# Patient Record
Sex: Female | Born: 1970 | Race: White | Hispanic: No | Marital: Single | Smoking: Never smoker
Health system: Southern US, Community
[De-identification: ages and names within clinical notes are randomized; demographics above are authoritative.]

## PROBLEM LIST (undated history)

## (undated) DIAGNOSIS — F329 Major depressive disorder, single episode, unspecified: Secondary | ICD-10-CM

## (undated) DIAGNOSIS — J302 Other seasonal allergic rhinitis: Secondary | ICD-10-CM

## (undated) DIAGNOSIS — F32A Depression, unspecified: Secondary | ICD-10-CM

## (undated) HISTORY — PX: CHOLECYSTECTOMY: SHX55

---

## 2005-07-17 ENCOUNTER — Observation Stay: Payer: Self-pay | Admitting: Obstetrics and Gynecology

## 2005-08-10 ENCOUNTER — Ambulatory Visit: Payer: Self-pay | Admitting: Obstetrics and Gynecology

## 2005-08-25 ENCOUNTER — Observation Stay: Payer: Self-pay | Admitting: Certified Nurse Midwife

## 2005-10-03 ENCOUNTER — Inpatient Hospital Stay: Payer: Self-pay | Admitting: Obstetrics and Gynecology

## 2007-02-24 ENCOUNTER — Ambulatory Visit: Payer: Self-pay | Admitting: Internal Medicine

## 2008-03-30 ENCOUNTER — Other Ambulatory Visit: Payer: Self-pay

## 2008-03-30 ENCOUNTER — Emergency Department: Payer: Self-pay | Admitting: Emergency Medicine

## 2008-04-02 ENCOUNTER — Ambulatory Visit: Payer: Self-pay

## 2008-07-14 ENCOUNTER — Ambulatory Visit: Payer: Self-pay | Admitting: Family Medicine

## 2009-03-25 ENCOUNTER — Ambulatory Visit: Payer: Self-pay | Admitting: Internal Medicine

## 2009-04-04 ENCOUNTER — Ambulatory Visit: Payer: Self-pay | Admitting: Surgery

## 2009-05-19 ENCOUNTER — Ambulatory Visit: Payer: Self-pay

## 2012-03-17 ENCOUNTER — Ambulatory Visit: Payer: Self-pay | Admitting: Internal Medicine

## 2012-06-01 ENCOUNTER — Ambulatory Visit: Payer: Self-pay | Admitting: Medical

## 2012-09-12 ENCOUNTER — Ambulatory Visit: Payer: Self-pay

## 2012-09-14 ENCOUNTER — Ambulatory Visit: Payer: Self-pay

## 2012-09-23 ENCOUNTER — Emergency Department: Payer: Self-pay | Admitting: Emergency Medicine

## 2012-09-23 LAB — COMPREHENSIVE METABOLIC PANEL
Albumin: 4.4 g/dL (ref 3.4–5.0)
Alkaline Phosphatase: 90 U/L (ref 50–136)
Anion Gap: 7 (ref 7–16)
Calcium, Total: 8.7 mg/dL (ref 8.5–10.1)
Chloride: 109 mmol/L — ABNORMAL HIGH (ref 98–107)
Co2: 24 mmol/L (ref 21–32)
EGFR (African American): >60
Glucose: 93 mg/dL (ref 65–99)
Osmolality: 280 (ref 275–301)
Potassium: 3.8 mmol/L (ref 3.5–5.1)
SGOT(AST): 20 U/L (ref 15–37)
SGPT (ALT): 24 U/L (ref 12–78)
Total Protein: 8.4 g/dL — ABNORMAL HIGH (ref 6.4–8.2)

## 2012-09-23 LAB — URINALYSIS, COMPLETE
Bilirubin,UR: NEGATIVE
Ketone: NEGATIVE
Nitrite: NEGATIVE
Squamous Epithelial: <1

## 2012-09-23 LAB — CBC
HCT: 41.6 % (ref 35.0–47.0)
HGB: 14.2 g/dL (ref 12.0–16.0)
MCH: 29.2 pg (ref 26.0–34.0)
MCHC: 34.2 g/dL (ref 32.0–36.0)
MCV: 85 fL (ref 80–100)
Platelet: 270 10*3/uL (ref 150–440)
RBC: 4.87 10*6/uL (ref 3.80–5.20)
WBC: 10.6 10*3/uL (ref 3.6–11.0)

## 2012-09-23 LAB — LIPASE, BLOOD: Lipase: 240 U/L (ref 73–393)

## 2013-08-04 ENCOUNTER — Ambulatory Visit: Payer: Self-pay | Admitting: Physician Assistant

## 2013-09-13 ENCOUNTER — Ambulatory Visit: Payer: Self-pay

## 2014-02-25 ENCOUNTER — Ambulatory Visit: Payer: Self-pay | Admitting: Physician Assistant

## 2014-02-25 LAB — RAPID STREP-A WITH REFLX: Micro Text Report: NEGATIVE

## 2014-02-28 LAB — BETA STREP CULTURE(ARMC)

## 2014-09-16 ENCOUNTER — Ambulatory Visit: Payer: Self-pay

## 2015-07-22 ENCOUNTER — Other Ambulatory Visit: Payer: Self-pay | Admitting: Obstetrics and Gynecology

## 2015-07-22 DIAGNOSIS — Z1231 Encounter for screening mammogram for malignant neoplasm of breast: Secondary | ICD-10-CM

## 2015-09-18 ENCOUNTER — Ambulatory Visit
Admission: RE | Admit: 2015-09-18 | Discharge: 2015-09-18 | Disposition: A | Discharge status: Home / Self Care | Payer: BLUE CROSS/BLUE SHIELD | Source: Ambulatory Visit | Attending: Obstetrics and Gynecology | Admitting: Obstetrics and Gynecology

## 2015-09-18 DIAGNOSIS — Z1231 Encounter for screening mammogram for malignant neoplasm of breast: Secondary | ICD-10-CM | POA: Diagnosis present

## 2016-03-02 ENCOUNTER — Ambulatory Visit
Admission: EM | Admit: 2016-03-02 | Discharge: 2016-03-02 | Disposition: A | Discharge status: Home / Self Care | Payer: BLUE CROSS/BLUE SHIELD | Attending: Family Medicine | Admitting: Family Medicine

## 2016-03-02 ENCOUNTER — Encounter: Payer: Self-pay | Admitting: Emergency Medicine

## 2016-03-02 DIAGNOSIS — S161XXA Strain of muscle, fascia and tendon at neck level, initial encounter: Secondary | ICD-10-CM

## 2016-03-02 MED ORDER — NAPROXEN 500 MG PO TABS: 500.0000 mg | ORAL_TABLET | Freq: Two times a day (BID) | ORAL | Status: DC

## 2016-03-02 MED ORDER — TIZANIDINE HCL 4 MG PO TABS: 4.0000 mg | ORAL_TABLET | Freq: Four times a day (QID) | ORAL | Status: DC | PRN

## 2016-03-02 NOTE — ED Notes (Signed)
Patient c/o pain in her right ear for 7 days. Patient denies fevers.

## 2016-03-02 NOTE — ED Provider Notes (Signed)
CSN: [id number]     Arrival date & time 03/02/16  1045 History   First MD Initiated Contact with Patient 03/02/16 1056     Chief Complaint  Patient presents with  . Otalgia   (Consider location/radiation/quality/duration/timing/severity/associated sxs/prior Treatment) HPI  So 45 year old female who presents with right-sided neck pain which she initially thought was in her ear but actually radiates into her septal area and down her neck. It does become more uncomfortable with certain motions. She does not remember any specific injury to her neck but does relate that she has been sleeping on 2 pillows which began about a week ago. She denies any upper extremity radicular symptoms.      History reviewed. No pertinent past medical history. Past Surgical History  Procedure Laterality Date  . Cholecystectomy     Family History  Problem Relation Age of Onset  . Breast cancer Neg Hx    Social History  Substance Use Topics  . Smoking status: Never Smoker   . Smokeless tobacco: None  . Alcohol Use: Yes   OB History    No data available     Review of Systems  Constitutional: Positive for activity change. Negative for fever, chills and fatigue.  Musculoskeletal: Positive for neck pain.  All other systems reviewed and are negative.   Allergies  Review of patient's allergies indicates no known allergies.  Home Medications   Prior to Admission medications   Medication Sig Start Date End Date Taking? Authorizing Provider  cetirizine (ZYRTEC) 10 MG tablet Take 10 mg by mouth daily.   Yes Historical Provider, MD  clonazePAM (KLONOPIN) 0.5 MG tablet Take 0.5 mg by mouth daily.   Yes Historical Provider, MD  desvenlafaxine (PRISTIQ) 50 MG 24 hr tablet Take 50 mg by mouth daily.   Yes Historical Provider, MD  naproxen (NAPROSYN) 500 MG tablet Take 1 tablet (500 mg total) by mouth 2 (two) times daily with a meal. 03/02/16   Lutricia FeilWilliam P Jamyah Folk, PA-C  tiZANidine (ZANAFLEX) 4 MG tablet Take 1  tablet (4 mg total) by mouth every 6 (six) hours as needed for muscle spasms. 03/02/16   Lutricia FeilWilliam P Leonidus Rowand, PA-C   Meds Ordered and Administered this Visit  Medications - No data to display  BP 118/75 mmHg  Pulse 87  Temp(Src) 98.1 F (36.7 C) (Oral)  Resp 16  Ht 5\' 8"  (1.727 m)  Wt 180 lb (81.647 kg)  BMI 27.38 kg/m2  SpO2 99%  LMP 02/11/2016 (Approximate) No data found.   Physical Exam  Constitutional: She is oriented to person, place, and time. She appears well-developed and well-nourished. No distress.  HENT:  Head: Normocephalic and atraumatic.  Right Ear: External ear normal.  Left Ear: External ear normal.  Nose: Nose normal.  Mouth/Throat: Oropharynx is clear and moist. No oropharyngeal exudate.  Eyes: Conjunctivae are normal. Pupils are equal, round, and reactive to light.  Neck: Neck supple.  Examination of the cervical spine shows increased range of motion to extension and rotation to the left and increased pain with reproduction of her symptoms to lateral flexion and extension. Tenderness to palpation in the occipital area swells along the paraspinous muscles on the right and sending into the trapezius. Upper extremity and strength is intact to testing and sensation is intact to light touch. DTRs are 2+ over 4 and symmetrical.  Musculoskeletal: Normal range of motion. She exhibits tenderness. She exhibits no edema.  Lymphadenopathy:    She has no cervical adenopathy.  Neurological: She is  alert and oriented to person, place, and time. She has normal reflexes. She exhibits normal muscle tone. Coordination normal.  Skin: Skin is warm and dry. She is not diaphoretic.  Psychiatric: She has a normal mood and affect. Her behavior is normal. Judgment and thought content normal.  Nursing note and vitals reviewed.   ED Course  Procedures (including critical care time)  Labs Review Labs Reviewed - No data to display  Imaging Review No results found.   Visual Acuity  Review  Right Eye Distance:   Left Eye Distance:   Bilateral Distance:    Right Eye Near:   Left Eye Near:    Bilateral Near:         MDM   1. Cervical strain, acute, initial encounter    Discharge Medication List as of 03/02/2016 11:28 AM    START taking these medications   Details  naproxen (NAPROSYN) 500 MG tablet Take 1 tablet (500 mg total) by mouth 2 (two) times daily with a meal., Starting 03/02/2016, Until Discontinued, Normal    tiZANidine (ZANAFLEX) 4 MG tablet Take 1 tablet (4 mg total) by mouth every 6 (six) hours as needed for muscle spasms., Starting 03/02/2016, Until Discontinued, Normal      Plan: 1. Test/x-ray results and diagnosis reviewed with patient 2. rx as per orders; risks, benefits, potential side effects reviewed with patient 3. Recommend supportive treatment with Symptom avoidance and rest. I recommended the use of heat or ice and Biofreeze for comfort. I will prescribe anti-inflammatory medication and a muscle relaxers. I have cautioned her regarding the use of a muscle relaxer  with activities requiring concentration and judgment. She should not drive while taking the muscle relaxers. If she is not improving she may return to the clinic. I've also asked her to remove one of the pillows and use the pillows that she normally sleeps on. This may be causing her pain. 4. F/u prn if symptoms worsen or don't improve     Lutricia FeilWilliam P Johnjoseph Rolfe, PA-C 03/02/16 1137

## 2016-03-02 NOTE — Discharge Instructions (Signed)
Cervical Sprain  A cervical sprain is an injury in the neck in which the strong, fibrous tissues (ligaments) that connect your neck bones stretch or tear. Cervical sprains can range from mild to severe. Severe cervical sprains can cause the neck vertebrae to be unstable. This can lead to damage of the spinal cord and can result in serious nervous system problems. The amount of time it takes for a cervical sprain to get better depends on the cause and extent of the injury. Most cervical sprains heal in 1 to 3 weeks.  CAUSES   Severe cervical sprains may be caused by:    Contact sport injuries (such as from football, rugby, wrestling, hockey, auto racing, gymnastics, diving, martial arts, or boxing).    Motor vehicle collisions.    Whiplash injuries. This is an injury from a sudden forward and backward whipping movement of the head and neck.   Falls.   Mild cervical sprains may be caused by:    Being in an awkward position, such as while cradling a telephone between your ear and shoulder.    Sitting in a chair that does not offer proper support.    Working at a poorly designed computer station.    Looking up or down for long periods of time.   SYMPTOMS    Pain, soreness, stiffness, or a burning sensation in the front, back, or sides of the neck. This discomfort may develop immediately after the injury or slowly, 24 hours or more after the injury.    Pain or tenderness directly in the middle of the back of the neck.    Shoulder or upper back pain.    Limited ability to move the neck.    Headache.    Dizziness.    Weakness, numbness, or tingling in the hands or arms.    Muscle spasms.    Difficulty swallowing or chewing.    Tenderness and swelling of the neck.   DIAGNOSIS   Most of the time your health care provider can diagnose a cervical sprain by taking your history and doing a physical exam. Your health care provider will ask about previous neck injuries and any known neck  problems, such as arthritis in the neck. X-rays may be taken to find out if there are any other problems, such as with the bones of the neck. Other tests, such as a CT scan or MRI, may also be needed.   TREATMENT   Treatment depends on the severity of the cervical sprain. Mild sprains can be treated with rest, keeping the neck in place (immobilization), and pain medicines. Severe cervical sprains are immediately immobilized. Further treatment is done to help with pain, muscle spasms, and other symptoms and may include:   Medicines, such as pain relievers, numbing medicines, or muscle relaxants.    Physical therapy. This may involve stretching exercises, strengthening exercises, and posture training. Exercises and improved posture can help stabilize the neck, strengthen muscles, and help stop symptoms from returning.   HOME CARE INSTRUCTIONS    Put ice on the injured area.     Put ice in a plastic bag.     Place a towel between your skin and the bag.     Leave the ice on for 15-20 minutes, 3-4 times a day.    If your injury was severe, you may have been given a cervical collar to wear. A cervical collar is a two-piece collar designed to keep your neck from moving while it heals.      Do not remove the collar unless instructed by your health care provider.    If you have long hair, keep it outside of the collar.    Ask your health care provider before making any adjustments to your collar. Minor adjustments may be required over time to improve comfort and reduce pressure on your chin or on the back of your head.    Ifyou are allowed to remove the collar for cleaning or bathing, follow your health care provider's instructions on how to do so safely.    Keep your collar clean by wiping it with mild soap and water and drying it completely. If the collar you have been given includes removable pads, remove them every 1-2 days and hand wash them with soap and water. Allow them to air dry. They should be completely  dry before you wear them in the collar.    If you are allowed to remove the collar for cleaning and bathing, wash and dry the skin of your neck. Check your skin for irritation or sores. If you see any, tell your health care provider.    Do not drive while wearing the collar.    Only take over-the-counter or prescription medicines for pain, discomfort, or fever as directed by your health care provider.    Keep all follow-up appointments as directed by your health care provider.    Keep all physical therapy appointments as directed by your health care provider.    Make any needed adjustments to your workstation to promote good posture.    Avoid positions and activities that make your symptoms worse.    Warm up and stretch before being active to help prevent problems.   SEEK MEDICAL CARE IF:    Your pain is not controlled with medicine.    You are unable to decrease your pain medicine over time as planned.    Your activity level is not improving as expected.   SEEK IMMEDIATE MEDICAL CARE IF:    You develop any bleeding.   You develop stomach upset.   You have signs of an allergic reaction to your medicine.    Your symptoms get worse.    You develop new, unexplained symptoms.    You have numbness, tingling, weakness, or paralysis in any part of your body.   MAKE SURE YOU:    Understand these instructions.   Will watch your condition.   Will get help right away if you are not doing well or get worse.     This information is not intended to replace advice given to you by your health care provider. Make sure you discuss any questions you have with your health care provider.     Document Released: 06/13/2007 Document Revised: 08/21/2013 Document Reviewed: 02/21/2013  Elsevier Interactive Patient Education 2016 [employer]

## 2016-07-27 ENCOUNTER — Other Ambulatory Visit: Payer: Self-pay | Admitting: Obstetrics and Gynecology

## 2016-07-27 DIAGNOSIS — Z1231 Encounter for screening mammogram for malignant neoplasm of breast: Secondary | ICD-10-CM

## 2016-09-14 ENCOUNTER — Ambulatory Visit
Admission: RE | Admit: 2016-09-14 | Discharge: 2016-09-14 | Disposition: A | Discharge status: Home / Self Care | Payer: BLUE CROSS/BLUE SHIELD | Source: Ambulatory Visit | Attending: Obstetrics and Gynecology | Admitting: Obstetrics and Gynecology

## 2016-09-14 DIAGNOSIS — Z1231 Encounter for screening mammogram for malignant neoplasm of breast: Secondary | ICD-10-CM | POA: Insufficient documentation

## 2018-05-28 ENCOUNTER — Ambulatory Visit
Admission: EM | Admit: 2018-05-28 | Discharge: 2018-05-28 | Disposition: A | Discharge status: Home / Self Care | Payer: 59 | Attending: Emergency Medicine | Admitting: Emergency Medicine

## 2018-05-28 ENCOUNTER — Encounter: Payer: Self-pay | Admitting: Gynecology

## 2018-05-28 ENCOUNTER — Other Ambulatory Visit: Payer: Self-pay

## 2018-05-28 DIAGNOSIS — T783XXA Angioneurotic edema, initial encounter: Secondary | ICD-10-CM | POA: Diagnosis not present

## 2018-05-28 MED ORDER — PREDNISONE 20 MG PO TABS: ORAL_TABLET | ORAL | 0 refills | Status: DC

## 2018-05-28 MED ORDER — TRIAMCINOLONE ACETONIDE 0.1 % MT PSTE: 1.0000 "application " | PASTE | Freq: Two times a day (BID) | OROMUCOSAL | 12 refills | Status: DC

## 2018-05-28 NOTE — ED Triage Notes (Signed)
Patient c/o x 1 week with mouth being painful. Pt. State swelling of mouth after using peroxide to wash her mouth out  last night.

## 2018-05-28 NOTE — ED Provider Notes (Signed)
[facility]    CSN: [id number] Arrival date & time: 05/28/18  1058     History   Chief Complaint Chief Complaint  Patient presents with  . Oral Swelling    HPI Joann Griffin is a 47 y.o. female.   HPI  47 year old female presents with a 1 week of her mouth being painful with ulcers on the buccal surface of her lower and upper lips.  Using over-the-counter medications.  So has been vaping and the mouthpiece became unattached to be used in superglue to hold it on place.  Having swelling of her mouth after using peroxide to wash her mouth out last night.  Presents with angioedema and she is had no respiratory symptoms.  Had no hives present.      History reviewed. No pertinent past medical history.  There are no active problems to display for this patient.   Past Surgical History:  Procedure Laterality Date  . CHOLECYSTECTOMY      OB History   None      Home Medications    Prior to Admission medications   Medication Sig Start Date End Date Taking? Authorizing Provider  acyclovir (ZOVIRAX) 200 MG capsule Take by mouth. 12/05/17  Yes [provider]  cetirizine (ZYRTEC) 10 MG tablet Take 10 mg by mouth daily.   Yes [provider]  desvenlafaxine (PRISTIQ) 50 MG 24 hr tablet Take 50 mg by mouth daily.   Yes [provider]  tiZANidine (ZANAFLEX) 4 MG tablet Take 1 tablet (4 mg total) by mouth every 6 (six) hours as needed for muscle spasms. 03/02/16  Yes Lutricia Feil, PA-C  predniSONE (DELTASONE) 20 MG tablet Take 2 tablets (40 mg) daily by mouth 05/28/18   Lutricia Feil, PA-C  triamcinolone (KENALOG) 0.1 % paste Use as directed 1 application in the mouth or throat 2 (two) times daily. 05/28/18   Lutricia Feil, PA-C    Family History Family History  Problem Relation Age of Onset  . Breast cancer Neg Hx     Social History Social History   Tobacco Use  . Smoking status: Never Smoker  . Smokeless tobacco: Never  Used  Substance Use Topics  . Alcohol use: Yes  . Drug use: Never     Allergies   Patient has no known allergies.   Review of Systems Review of Systems  Constitutional: Positive for activity change. Negative for appetite change, chills, fatigue and fever.  HENT: Positive for facial swelling.   Respiratory: Negative for shortness of breath, wheezing and stridor.   All other systems reviewed and are negative.    Physical Exam Triage Vital Signs ED Triage Vitals  Enc Vitals Group     BP 05/28/18 1129 117/83     Pulse Rate 05/28/18 1129 77     Resp 05/28/18 1129 16     Temp 05/28/18 1129 98.9 F (37.2 C)     Temp Source 05/28/18 1129 Oral     SpO2 05/28/18 1129 99 %     Weight 05/28/18 1131 180 lb (81.6 kg)     Height 05/28/18 1131 5\' 8"  (1.727 m)     Head Circumference --      Peak Flow --      Pain Score 05/28/18 1130 5     Pain Loc --      Pain Edu? --      Excl. in GC? --    No data found.  Updated Vital Signs BP  117/83 (BP Location: Left Arm)   Pulse 77   Temp 98.9 F (37.2 C) (Oral)   Resp 16   Ht 5\' 8"  (1.727 m)   Wt 180 lb (81.6 kg)   LMP 05/21/2018   SpO2 99%   BMI 27.37 kg/m   Visual Acuity Right Eye Distance:   Left Eye Distance:   Bilateral Distance:    Right Eye Near:   Left Eye Near:    Bilateral Near:     Physical Exam  Constitutional: She is oriented to person, place, and time. She appears well-developed and well-nourished. No distress.  HENT:  Head: Normocephalic.  Nose: Nose normal.  Mouth/Throat: Oropharynx is clear and moist. No oropharyngeal exudate.  Exam shows a swelling that is symmetrical of her lips and mouth.  Extend into the cheeks or jaw.  Lamination of the buccal surface of her inner lips shows a flat plaque type of lesion.  No ulcerations are seen today.  No pitting edema present.  Gums are nontender.  Eyes: Pupils are equal, round, and reactive to light.  Neck: Normal range of motion. Neck supple.  Pulmonary/Chest:  Effort normal and breath sounds normal.  Musculoskeletal: Normal range of motion.  Lymphadenopathy:    She has no cervical adenopathy.  Neurological: She is alert and oriented to person, place, and time.  Skin: Skin is warm and dry. She is not diaphoretic.  Psychiatric: She has a normal mood and affect. Her behavior is normal. Judgment and thought content normal.  Nursing note and vitals reviewed.    UC Treatments / Results  Labs (all labs ordered are listed, but only abnormal results are displayed) Labs Reviewed - No data to display  EKG None  Radiology No results found.  Procedures Procedures (including critical care time)  Medications Ordered in UC Medications - No data to display  Initial Impression / Assessment and Plan / UC Course  I have reviewed the triage vital signs and the nursing notes.  Pertinent labs & imaging results that were available during my care of the patient were reviewed by me and considered in my medical decision making (see chart for details).     Discontinue the use of peroxide and all the other mouthwashes that she has been using.  Laser on prednisone for 4 days Kenalog paste for use in her mouth have recommended she use Zyrtec 10 mg twice daily for 4 or 5 days until the swelling has decreased.  He has any compromise of her breathing she should down 911 and go to the emergency room immediately.  Was recommend she follow-up with her primary care physician next week. Final Clinical Impressions(s) / UC Diagnoses   Final diagnoses:  Angioedema of lips, initial encounter     Discharge Instructions     Use Zyrtec 10 mg twice daily for the next 4 to 5 days.  Apply the Kenalog and Orabase to the lesions inside your mouth steady for several days.  Follow up with your primary care physician if you are not improving in 4 to 5 days    ED Prescriptions    Medication Sig Dispense Auth. Provider   predniSONE (DELTASONE) 20 MG tablet Take 2 tablets (40  mg) daily by mouth 8 tablet Ovid Curd P, PA-C   triamcinolone (KENALOG) 0.1 % paste Use as directed 1 application in the mouth or throat 2 (two) times daily. 5 g Lutricia Feil, PA-C     Controlled Substance Prescriptions  Controlled Substance Registry consulted? Not  Applicable   Lutricia Feil, PA-C 05/28/18 1801

## 2018-05-28 NOTE — Discharge Instructions (Addendum)
Use Zyrtec 10 mg twice daily for the next 4 to 5 days.  Apply the Kenalog and Orabase to the lesions inside your mouth steady for several days.  Follow up with your primary care physician if you are not improving in 4 to 5 days

## 2018-06-02 ENCOUNTER — Ambulatory Visit (INDEPENDENT_AMBULATORY_CARE_PROVIDER_SITE_OTHER)

## 2018-06-02 ENCOUNTER — Ambulatory Visit
Admission: EM | Admit: 2018-06-02 | Discharge: 2018-06-02 | Disposition: A | Discharge status: Home / Self Care | Attending: Family Medicine | Admitting: Family Medicine

## 2018-06-02 DIAGNOSIS — S63501A Unspecified sprain of right wrist, initial encounter: Secondary | ICD-10-CM

## 2018-06-02 DIAGNOSIS — X509XXA Other and unspecified overexertion or strenuous movements or postures, initial encounter: Secondary | ICD-10-CM | POA: Diagnosis not present

## 2018-06-02 DIAGNOSIS — M79631 Pain in right forearm: Secondary | ICD-10-CM | POA: Diagnosis not present

## 2018-06-02 NOTE — ED Triage Notes (Signed)
Pt here for right arm pain while at work. States she was moving something heavy and she heard something pop in her right arm and now it's swollen. Also reports redness in the corner of her right eye, just noticed it today. No otc meds tried for the arm or ice applied.

## 2018-06-02 NOTE — ED Provider Notes (Signed)
[facility]    CSN: [id number] Arrival date & time: 06/02/18  1528     History   Chief Complaint Chief Complaint  Patient presents with  . Arm Pain    HPI Joann Griffin is a 47 y.o. female.   47 yo female with a c/o right forearm pain and swelling that started today abruptly when she grabbed heavy crate at work. States she grabbed the crate, went to move it and "felt something pop".   The history is provided by the patient.  Arm Pain     History reviewed. No pertinent past medical history.  There are no active problems to display for this patient.   Past Surgical History:  Procedure Laterality Date  . CHOLECYSTECTOMY      OB History   None      Home Medications    Prior to Admission medications   Medication Sig Start Date End Date Taking? Authorizing Provider  acyclovir (ZOVIRAX) 200 MG capsule Take by mouth. 12/05/17  Yes [provider]  cetirizine (ZYRTEC) 10 MG tablet Take 10 mg by mouth daily.   Yes [provider]  desvenlafaxine (PRISTIQ) 50 MG 24 hr tablet Take 50 mg by mouth daily.   Yes [provider]  triamcinolone (KENALOG) 0.1 % paste Use as directed 1 application in the mouth or throat 2 (two) times daily. 05/28/18  Yes Lutricia Feil, PA-C  predniSONE (DELTASONE) 20 MG tablet Take 2 tablets (40 mg) daily by mouth 05/28/18   Lutricia Feil, PA-C  tiZANidine (ZANAFLEX) 4 MG tablet Take 1 tablet (4 mg total) by mouth every 6 (six) hours as needed for muscle spasms. 03/02/16   Lutricia Feil, PA-C    Family History Family History  Problem Relation Age of Onset  . Breast cancer Neg Hx     Social History Social History   Tobacco Use  . Smoking status: Never Smoker  . Smokeless tobacco: Never Used  Substance Use Topics  . Alcohol use: Yes  . Drug use: Never     Allergies   Patient has no known allergies.   Review of Systems Review of Systems   Physical Exam Triage Vital Signs ED  Triage Vitals  Enc Vitals Group     BP 06/02/18 1542 (!) 141/93     Pulse Rate 06/02/18 1542 76     Resp 06/02/18 1542 18     Temp 06/02/18 1541 98.3 F (36.8 C)     Temp Source 06/02/18 1541 Oral     SpO2 06/02/18 1542 100 %     Weight 06/02/18 1542 180 lb (81.6 kg)     Height --      Head Circumference --      Peak Flow --      Pain Score 06/02/18 1542 0     Pain Loc --      Pain Edu? --      Excl. in GC? --    No data found.  Updated Vital Signs BP (!) 141/93 (BP Location: Left Arm)   Pulse 76   Temp 98.3 F (36.8 C) (Oral)   Resp 18   Wt 81.6 kg   LMP 05/21/2018 (Exact Date)   SpO2 100%   BMI 27.37 kg/m   Visual Acuity Right Eye Distance:   Left Eye Distance:   Bilateral Distance:    Right Eye Near:   Left Eye Near:    Bilateral Near:     Physical Exam  Constitutional: She appears well-developed and well-nourished. No distress.  Musculoskeletal:       Right wrist: Normal.       Right forearm: She exhibits tenderness, bony tenderness and swelling. She exhibits no deformity and no laceration.  Right upper extremity neurovascularly intact; normal range of motion; tenderness and edema noted to the  forearm  Skin: She is not diaphoretic.  Nursing note and vitals reviewed.    UC Treatments / Results  Labs (all labs ordered are listed, but only abnormal results are displayed) Labs Reviewed - No data to display  EKG None  Radiology Dg Forearm Right  Result Date: 06/02/2018 CLINICAL DATA:  Right forearm pain.  Possible lifting injury. EXAM: RIGHT FOREARM - 2 VIEW COMPARISON:  No recent prior. FINDINGS: No acute soft tissue bony abnormality identified. No evidence of fracture or dislocation. IMPRESSION: No acute abnormality. Electronically Signed   By: Maisie Fus  Register   On: 06/02/2018 16:06    Procedures Procedures (including critical care time)  Medications Ordered in UC Medications - No data to display  Initial Impression / Assessment and Plan / UC  Course  I have reviewed the triage vital signs and the nursing notes.  Pertinent labs & imaging results that were available during my care of the patient were reviewed by me and considered in my medical decision making (see chart for details).      Final Clinical Impressions(s) / UC Diagnoses   Final diagnoses:  Forearm sprain, right, initial encounter     Discharge Instructions     Rest, ice, over the counter ibuprofen and/or tylenol Follow up with Occupational Health at [facility] next week    ED Prescriptions    None     1. x-ray result (negative) and diagnosis reviewed with patient 2.. Recommend supportive treatment as above 3. Follow-up at occupational health clinic at [facility] next week 4. Work restrictions (light duty) as per form (see scanned form)  Controlled Substance Prescriptions [place] Controlled Substance Registry consulted? Not Applicable   Payton Mccallum, MD 06/02/18 2043

## 2018-06-02 NOTE — Discharge Instructions (Signed)
Rest, ice, over the counter ibuprofen and/or tylenol Follow up with Occupational Health at [facility] next week

## 2018-06-08 ENCOUNTER — Ambulatory Visit
Admission: RE | Admit: 2018-06-08 | Discharge: 2018-06-08 | Disposition: A | Discharge status: Home / Self Care | Source: Ambulatory Visit | Attending: Family Medicine | Admitting: Family Medicine

## 2018-06-08 ENCOUNTER — Other Ambulatory Visit: Payer: Self-pay | Admitting: Family Medicine

## 2018-06-08 DIAGNOSIS — M25531 Pain in right wrist: Secondary | ICD-10-CM | POA: Insufficient documentation

## 2018-06-08 DIAGNOSIS — M79641 Pain in right hand: Secondary | ICD-10-CM | POA: Diagnosis present

## 2018-06-08 DIAGNOSIS — R52 Pain, unspecified: Secondary | ICD-10-CM

## 2018-12-30 ENCOUNTER — Other Ambulatory Visit: Payer: Self-pay

## 2018-12-30 ENCOUNTER — Ambulatory Visit
Admission: EM | Admit: 2018-12-30 | Discharge: 2018-12-30 | Disposition: A | Discharge status: Home / Self Care | Payer: 59 | Attending: Family Medicine | Admitting: Family Medicine

## 2018-12-30 ENCOUNTER — Encounter: Payer: Self-pay | Admitting: Emergency Medicine

## 2018-12-30 DIAGNOSIS — M545 Low back pain, unspecified: Secondary | ICD-10-CM

## 2018-12-30 DIAGNOSIS — M546 Pain in thoracic spine: Secondary | ICD-10-CM

## 2018-12-30 HISTORY — DX: Major depressive disorder, single episode, unspecified: F32.9

## 2018-12-30 HISTORY — DX: Depression, unspecified: F32.A

## 2018-12-30 MED ORDER — PREDNISONE 10 MG PO TABS: ORAL_TABLET | ORAL | 0 refills | Status: DC

## 2018-12-30 MED ORDER — CYCLOBENZAPRINE HCL 10 MG PO TABS: 10.0000 mg | ORAL_TABLET | Freq: Two times a day (BID) | ORAL | 0 refills | Status: AC | PRN

## 2018-12-30 NOTE — Discharge Instructions (Addendum)
Take medication as prescribed. Rest. Drink plenty of fluids. Ice/Heat. Stretch.   Follow up with your primary care physician this week as needed. Return to Urgent care for new or worsening concerns.   

## 2018-12-30 NOTE — ED Provider Notes (Signed)
[facility] ____________________________________________  Time seen: Approximately 1:32 PM  I have reviewed the triage vital signs and the nursing notes.   HISTORY  Chief Complaint Back Pain  HPI Joann Griffin is a 48 y.o. female presenting for evaluation of mid to lower back pain present for approximately 1 week.  States pain improves with lying down and resting, worse with movement and direct palpation.  Has been taking ibuprofen and using over-the-counter Biofreeze without resolution.  States pain is a catching spasming like pain with movement.  Does report history of osteoarthritis and degenerative disc disease.  Denies fall, direct injury or direct trauma.  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  Denies abrupt onset.  States woke up with the pain and gradual increase.  Denies accompanying chest pain, shortness of breath, abdominal pain, dysuria, hematuria, paresthesias, urinary or bowel changes.  Reports otherwise doing well.  Joann GalaGrandis, Heidi, MD: PCP     Past Medical History:  Diagnosis Date  . Depression     There are no active problems to display for this patient.   Past Surgical History:  Procedure Laterality Date  . CHOLECYSTECTOMY       No current facility-administered medications for this encounter.   Current Outpatient Medications:  .  acyclovir (ZOVIRAX) 200 MG capsule, Take by mouth., Disp: , Rfl:  .  cetirizine (ZYRTEC) 10 MG tablet, Take 10 mg by mouth daily., Disp: , Rfl:  .  desvenlafaxine (PRISTIQ) 50 MG 24 hr tablet, Take 50 mg by mouth daily., Disp: , Rfl:  .  cyclobenzaprine (FLEXERIL) 10 MG tablet, Take 1 tablet (10 mg total) by mouth 2 (two) times daily as needed for muscle spasms. Do not drive while taking as can cause drowsiness, Disp: 15 tablet, Rfl: 0 .  predniSONE (DELTASONE) 10 MG tablet, Start 60 mg po day one, then 50 mg po day two, taper by 10 mg daily until complete., Disp: 21 tablet, Rfl: 0   Allergies Patient has no known allergies.  Family History  Problem Relation Age of Onset  . Breast cancer Neg Hx     Social History Social History   Tobacco Use  . Smoking status: Never Smoker  . Smokeless tobacco: Never Used  Substance Use Topics  . Alcohol use: Yes  . Drug use: Never    Review of Systems Constitutional: No fever Cardiovascular: Denies chest pain. Respiratory: Denies shortness of breath. Gastrointestinal: No abdominal pain.  No nausea, no vomiting.  No diarrhea.  Genitourinary: Negative for dysuria. Musculoskeletal: Positive for back pain. Skin: Negative for rash.   ____________________________________________   PHYSICAL EXAM:  VITAL SIGNS: ED Triage Vitals  Enc Vitals Group     BP 12/30/18 1225 118/86     Pulse Rate 12/30/18 1225 72     Resp 12/30/18 1225 18     Temp 12/30/18 1225 98.4 F (36.9 C)     Temp Source 12/30/18 1225 Oral     SpO2 12/30/18 1225 99 %     Weight 12/30/18 1223 170 lb (77.1 kg)     Height 12/30/18 1223 5\' 8"  (1.727 m)     Head Circumference --      Peak Flow --      Pain Score 12/30/18 1223 7     Pain Loc --      Pain Edu? --      Excl. in GC? --     Constitutional: Alert and oriented. Well appearing and  in no acute distress. ENT      Head: Normocephalic and atraumatic. Cardiovascular: Normal rate, regular rhythm. Grossly normal heart sounds.  Good peripheral circulation. Respiratory: Normal respiratory effort without tachypnea nor retractions. Breath sounds are clear and equal bilaterally. No wheezes, rales, rhonchi. Gastrointestinal: Soft and nontender.  No CVA tenderness. Musculoskeletal: Steady gait.  Bilateral distal pedal pulses equal easily palpated.  No cervical tenderness palpation. Except: Diffuse lower thoracic and lumbar tenderness to palpation, mildly to midline and increased tenderness to bilateral latissimus dorsi, pain increases with lumbar flexion and extension as well as rotation but full range  of motion present, no saddle anesthesia, no pain with standing bilateral knee lifts, steady gait. Neurologic:  Normal speech and language. No gross focal neurologic deficits are appreciated. Speech is normal. No gait instability.  Skin:  Skin is warm, dry and intact. No rash noted. Psychiatric: Mood and affect are normal. Speech and behavior are normal. Patient exhibits appropriate insight and judgment   ___________________________________________   LABS (all labs ordered are listed, but only abnormal results are displayed)  Labs Reviewed - No data to display  PROCEDURES Procedures   INITIAL IMPRESSION / ASSESSMENT AND PLAN / ED COURSE  Pertinent labs & imaging results that were available during my care of the patient were reviewed by me and considered in my medical decision making (see chart for details).  Well-appearing patient.  No acute distress.  Suspect muscular strain injury.  Will treat with oral prednisone and PRN Flexeril.  Encouraged rest, fluids, supportive care, stretch, ice and heat.  Work note given for today.Discussed indication, risks and benefits of medications with patient.  Discussed follow up with Primary care physician this week. Discussed follow up and return parameters including no resolution or any worsening concerns. Patient verbalized understanding and agreed to plan.   ____________________________________________   FINAL CLINICAL IMPRESSION(S) / ED DIAGNOSES  Final diagnoses:  Acute bilateral thoracic back pain  Acute bilateral low back pain without sciatica     ED Discharge Orders         Ordered    predniSONE (DELTASONE) 10 MG tablet     12/30/18 1305    cyclobenzaprine (FLEXERIL) 10 MG tablet  2 times daily PRN     12/30/18 1305           Note: This dictation was prepared with Dragon dictation along with smaller phrase technology. Any transcriptional errors that result from this process are unintentional.         Renford Dills, NP  12/30/18 1358

## 2018-12-30 NOTE — ED Triage Notes (Signed)
Patient c/o mid back pain that started 1 week ago. 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 She has been using biofreeze with no relief.

## 2019-07-05 ENCOUNTER — Other Ambulatory Visit: Payer: Self-pay

## 2019-07-05 DIAGNOSIS — Z20822 Contact with and (suspected) exposure to covid-19: Secondary | ICD-10-CM

## 2019-07-06 LAB — NOVEL CORONAVIRUS, NAA: SARS-CoV-2, NAA: NOT DETECTED

## 2019-11-02 ENCOUNTER — Other Ambulatory Visit: Payer: Self-pay | Admitting: Family Medicine

## 2019-11-02 DIAGNOSIS — Z1231 Encounter for screening mammogram for malignant neoplasm of breast: Secondary | ICD-10-CM

## 2019-11-21 ENCOUNTER — Ambulatory Visit
Admission: RE | Admit: 2019-11-21 | Discharge: 2019-11-21 | Disposition: A | Discharge status: Home / Self Care | Payer: 59 | Source: Ambulatory Visit | Attending: Family Medicine | Admitting: Family Medicine

## 2019-11-21 ENCOUNTER — Other Ambulatory Visit: Payer: Self-pay

## 2019-11-21 DIAGNOSIS — Z1231 Encounter for screening mammogram for malignant neoplasm of breast: Secondary | ICD-10-CM | POA: Insufficient documentation

## 2020-04-28 IMAGING — MG DIGITAL SCREENING BILAT W/ TOMO W/ CAD
8 series · 8 of 24 positions shown · non-contrast
Comparison: Previous exam(s).

CLINICAL DATA: Screening.

EXAM:
DIGITAL SCREENING BILATERAL MAMMOGRAM WITH TOMO AND CAD

[R MLO synth-2D]
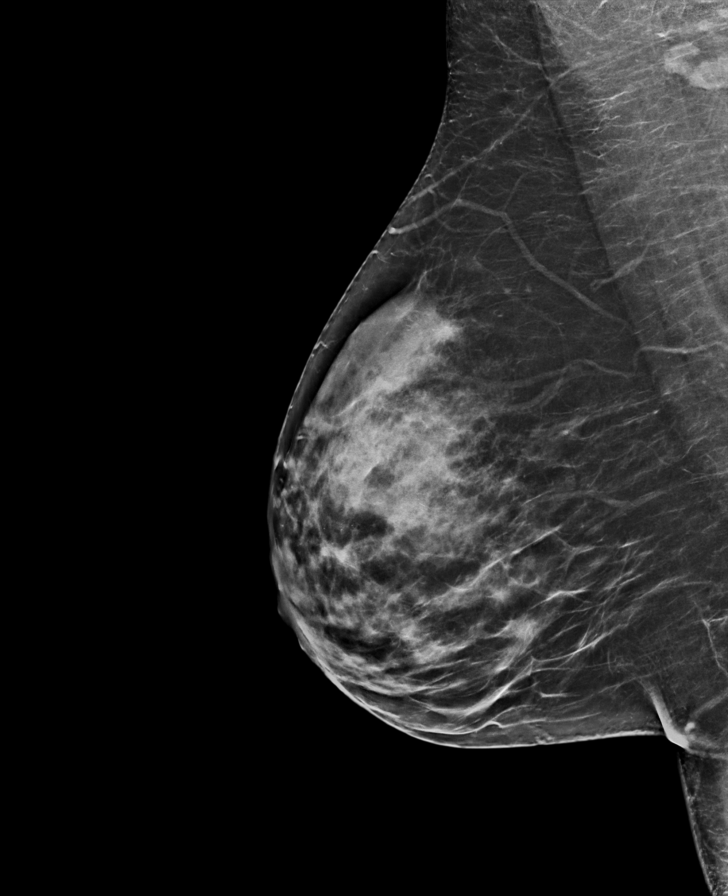

[L CC synth-2D]
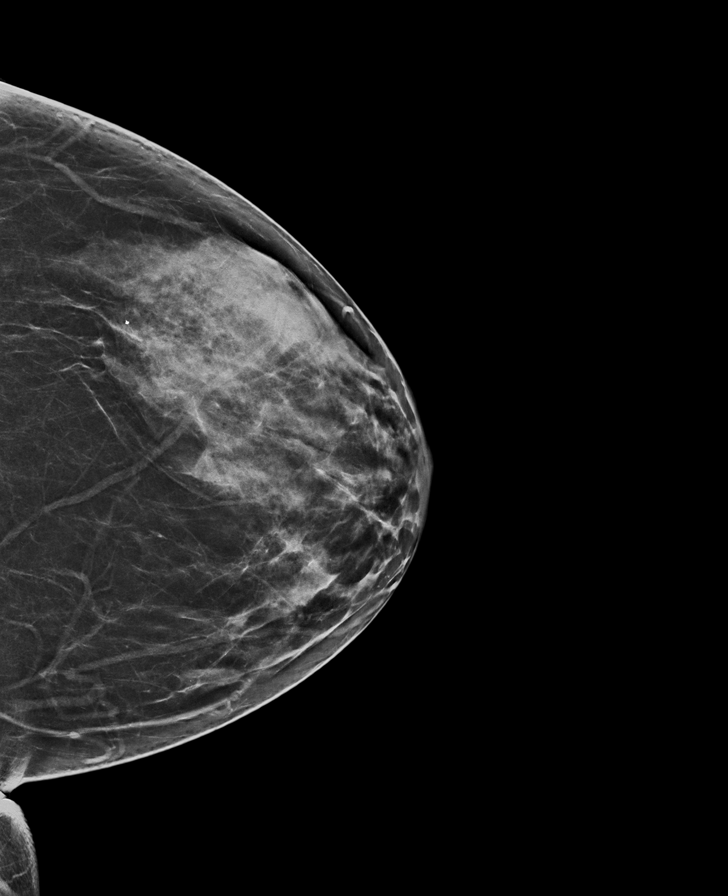

[R CC synth-2D]
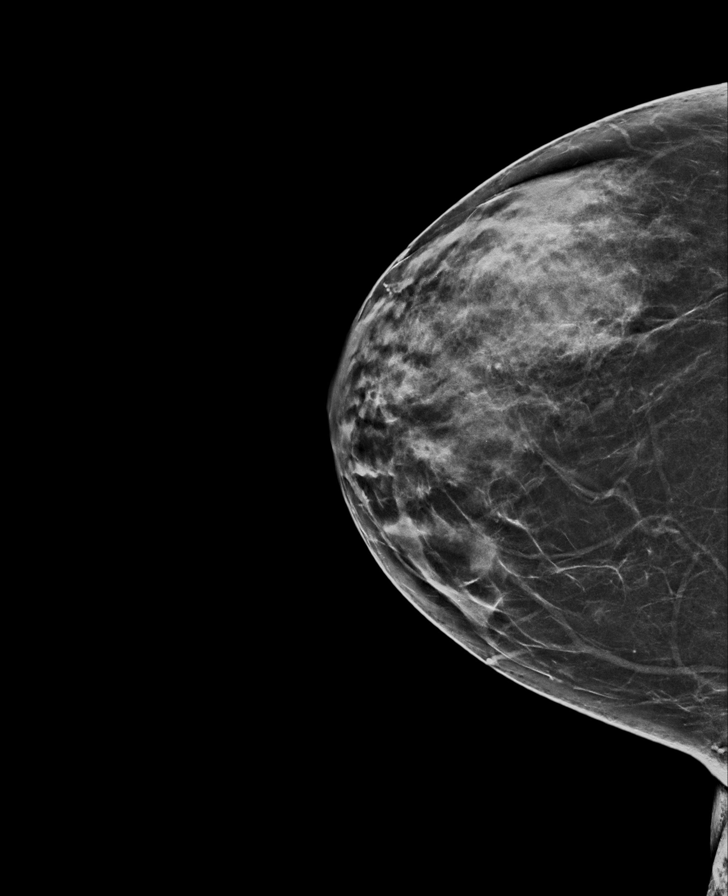

[L MLO synth-2D]
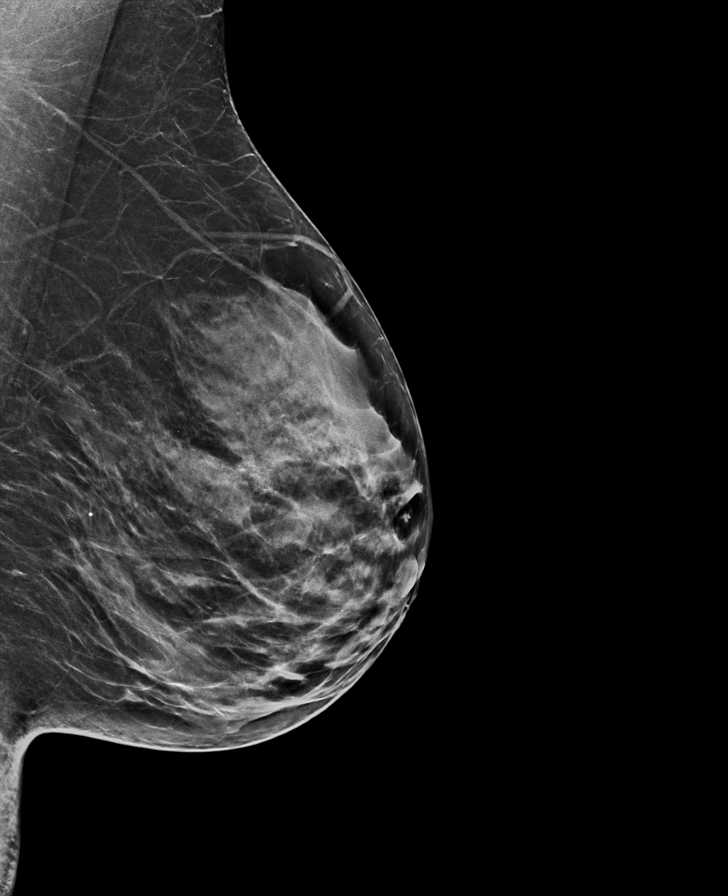

[R CC tomo · tomo slice 33/66.0]
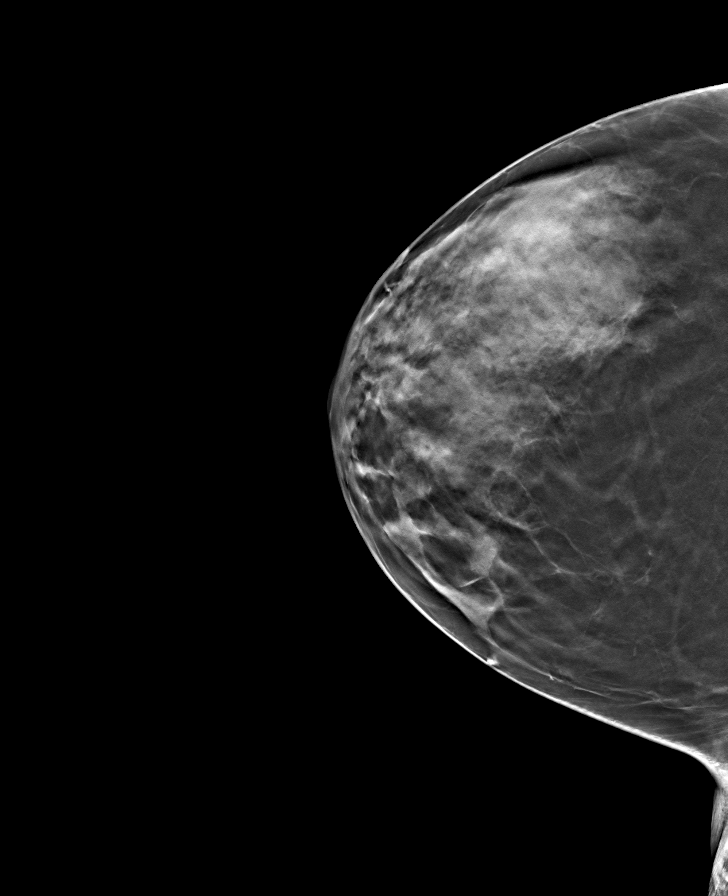

[L CC tomo · tomo slice 35/70.0]
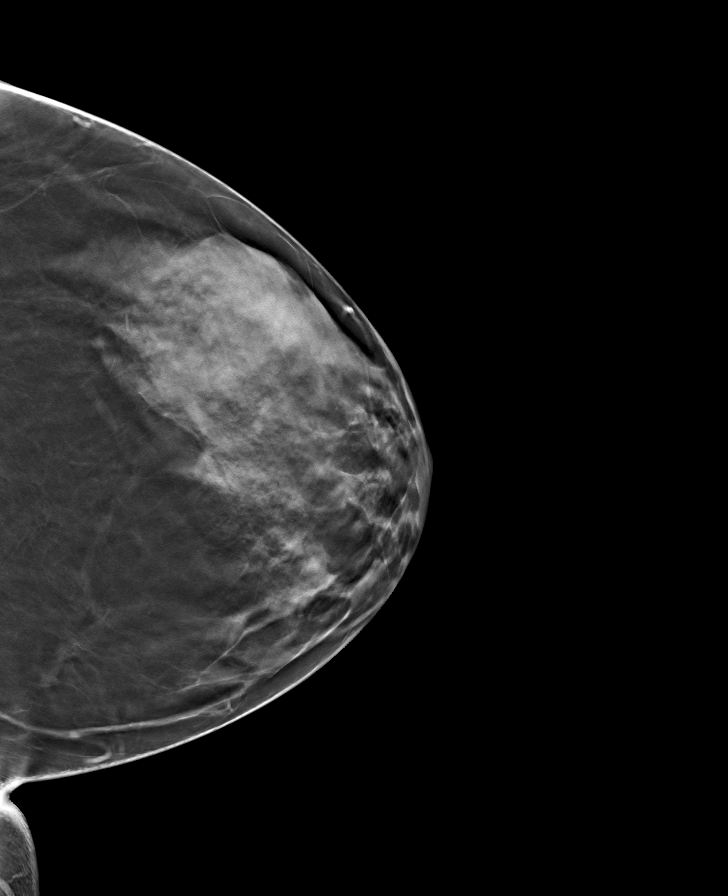

[L MLO tomo · tomo slice 33/65.0]
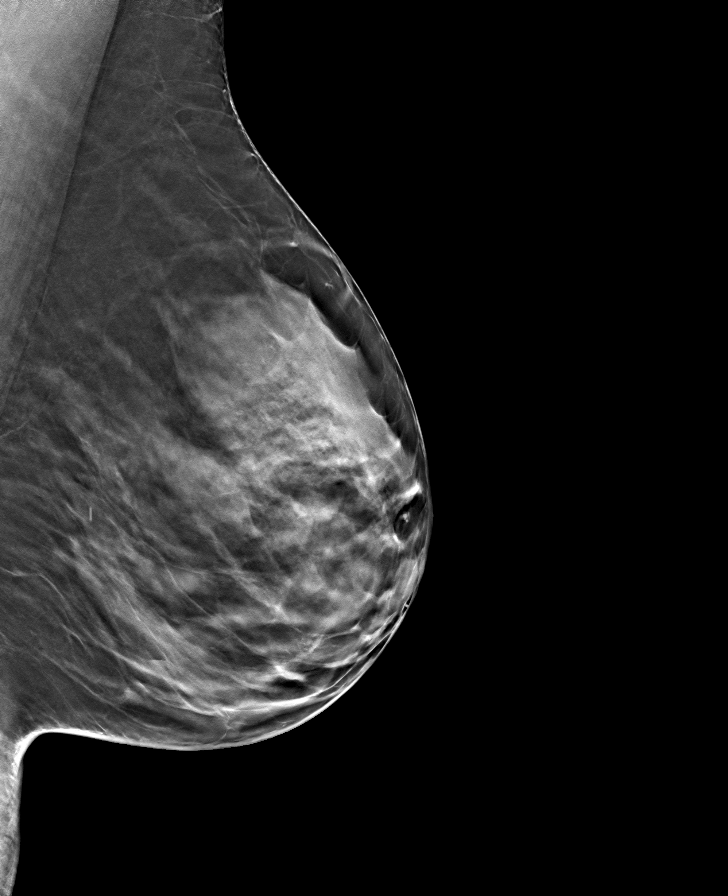

[R MLO tomo · tomo slice 37/73.0]
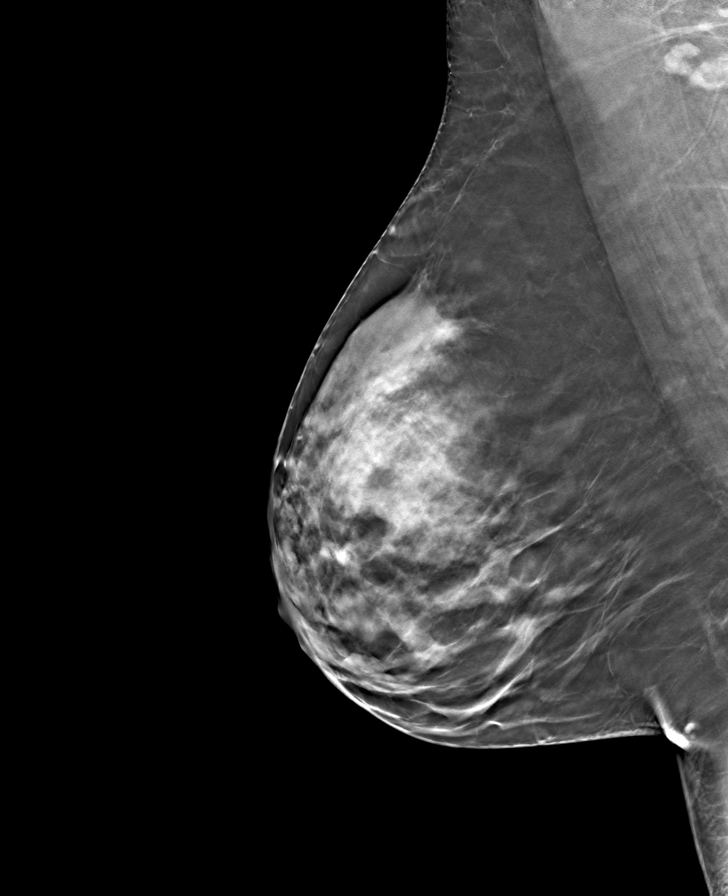

[8 of 24 positions shown; findings below may reference images not displayed]

ACR Breast Density Category c: The breast tissue is heterogeneously
dense, which may obscure small masses.
FINDINGS: There are no findings suspicious for malignancy. Images were
processed with CAD.
IMPRESSION: No mammographic evidence of malignancy. A result letter of this
screening mammogram will be mailed directly to the patient.

RECOMMENDATION:
Screening mammogram in one year. (Code:[id number])

BI-RADS CATEGORY  1: Negative.

## 2020-05-27 ENCOUNTER — Other Ambulatory Visit: Payer: Self-pay

## 2020-05-27 ENCOUNTER — Encounter: Payer: Self-pay | Admitting: Emergency Medicine

## 2020-05-27 ENCOUNTER — Ambulatory Visit
Admission: EM | Admit: 2020-05-27 | Discharge: 2020-05-27 | Disposition: A | Discharge status: Home / Self Care | Payer: 59 | Attending: Physician Assistant | Admitting: Physician Assistant

## 2020-05-27 DIAGNOSIS — R05 Cough: Secondary | ICD-10-CM | POA: Diagnosis present

## 2020-05-27 DIAGNOSIS — Z9049 Acquired absence of other specified parts of digestive tract: Secondary | ICD-10-CM | POA: Diagnosis not present

## 2020-05-27 DIAGNOSIS — Z20822 Contact with and (suspected) exposure to covid-19: Secondary | ICD-10-CM | POA: Diagnosis not present

## 2020-05-27 DIAGNOSIS — Z79899 Other long term (current) drug therapy: Secondary | ICD-10-CM | POA: Insufficient documentation

## 2020-05-27 DIAGNOSIS — R0982 Postnasal drip: Secondary | ICD-10-CM | POA: Diagnosis not present

## 2020-05-27 DIAGNOSIS — J069 Acute upper respiratory infection, unspecified: Secondary | ICD-10-CM | POA: Diagnosis not present

## 2020-05-27 DIAGNOSIS — F329 Major depressive disorder, single episode, unspecified: Secondary | ICD-10-CM | POA: Diagnosis not present

## 2020-05-27 DIAGNOSIS — J029 Acute pharyngitis, unspecified: Secondary | ICD-10-CM

## 2020-05-27 LAB — GROUP A STREP BY PCR: Group A Strep by PCR: NOT DETECTED

## 2020-05-27 LAB — SARS CORONAVIRUS 2 (TAT 6-24 HRS): SARS Coronavirus 2: NEGATIVE

## 2020-05-27 MED ORDER — PSEUDOEPH-BROMPHEN-DM 30-2-10 MG/5ML PO SYRP: 10.0000 mL | ORAL_SOLUTION | Freq: Four times a day (QID) | ORAL | 0 refills | Status: AC | PRN

## 2020-05-27 MED ORDER — IPRATROPIUM BROMIDE 0.06 % NA SOLN: 2.0000 | Freq: Four times a day (QID) | NASAL | 1 refills | Status: AC

## 2020-05-27 NOTE — Discharge Instructions (Addendum)

## 2020-05-27 NOTE — ED Triage Notes (Signed)
Patient c/o cough, nasal congestion, sore throat and sneezing that started 5 days ago. Denies fever.

## 2020-05-27 NOTE — ED Provider Notes (Signed)
[facility]    CSN: [id number] Arrival date & time: 05/27/20  1118      History   Chief Complaint Chief Complaint  Patient presents with  . Cough  . Nasal Congestion  . Sore Throat    HPI Joann Griffin is a 49 y.o. female presenting for 5-day history of nasal congestion, cough, sore throat and sneezing.  Patient says she feels like she is getting worse.  She has been taking over-the-counter Sudafed without relief.  Patient denies any fever, fatigue, chest pain, breathing difficulty.  She denies any known Covid exposure and is fully vaccinated for Covid.  No other concerns at this time.  HPI  Past Medical History:  Diagnosis Date  . Depression     There are no problems to display for this patient.   Past Surgical History:  Procedure Laterality Date  . CHOLECYSTECTOMY      OB History   No obstetric history on file.      Home Medications    Prior to Admission medications   Medication Sig Start Date End Date Taking? Authorizing Provider  desvenlafaxine (PRISTIQ) 50 MG 24 hr tablet Take 50 mg by mouth daily.   Yes [provider]  acyclovir (ZOVIRAX) 200 MG capsule Take by mouth. 12/05/17   [provider]  brompheniramine-pseudoephedrine-DM 30-2-10 MG/5ML syrup Take 10 mLs by mouth 4 (four) times daily as needed for up to 7 days. 05/27/20 06/03/20  Eusebio Friendly B, PA-C  cetirizine (ZYRTEC) 10 MG tablet Take 10 mg by mouth daily.    [provider]  cyclobenzaprine (FLEXERIL) 10 MG tablet Take 1 tablet (10 mg total) by mouth 2 (two) times daily as needed for muscle spasms. Do not drive while taking as can cause drowsiness 12/30/18   Renford Dills, NP  ipratropium (ATROVENT) 0.06 % nasal spray Place 2 sprays into both nostrils 4 (four) times daily for 7 days. 05/27/20 06/03/20  Shirlee Latch, PA-C  predniSONE (DELTASONE) 10 MG tablet Start 60 mg po day one, then 50 mg po day two, taper by 10 mg daily until complete. 12/30/18   Renford Dills, NP    Family History Family History  Problem Relation Age of Onset  . Healthy Mother   . Diabetes Father   . Breast cancer Neg Hx     Social History Social History   Tobacco Use  . Smoking status: Never Smoker  . Smokeless tobacco: Never Used  Vaping Use  . Vaping Use: Every day  Substance Use Topics  . Alcohol use: Yes  . Drug use: Never     Allergies   Patient has no known allergies.   Review of Systems Review of Systems  Constitutional: Negative for chills, diaphoresis, fatigue and fever.  HENT: Positive for congestion, rhinorrhea, sneezing and sore throat. Negative for ear pain, sinus pressure and sinus pain.   Respiratory: Positive for cough. Negative for shortness of breath.   Gastrointestinal: Negative for abdominal pain, nausea and vomiting.  Musculoskeletal: Negative for arthralgias and myalgias.  Skin: Negative for rash.  Neurological: Negative for weakness and headaches.  Hematological: Negative for adenopathy.     Physical Exam Triage Vital Signs ED Triage Vitals  Enc Vitals Group     BP 05/27/20 1157 (!) 139/98     Pulse Rate 05/27/20 1157 68     Resp 05/27/20 1157 18     Temp 05/27/20 1157 98.2 F (36.8 C)     Temp Source 05/27/20 1157 Oral  SpO2 05/27/20 1157 100 %     Weight 05/27/20 1155 180 lb (81.6 kg)     Height 05/27/20 1155 5\' 8"  (1.727 m)     Head Circumference --      Peak Flow --      Pain Score 05/27/20 1155 4     Pain Loc --      Pain Edu? --      Excl. in GC? --    No data found.  Updated Vital Signs BP (!) 139/98 (BP Location: Right Arm)   Pulse 68   Temp 98.2 F (36.8 C) (Oral)   Resp 18   Ht 5\' 8"  (1.727 m)   Wt 180 lb (81.6 kg)   SpO2 100%   BMI 27.37 kg/m       Physical Exam Vitals and nursing note reviewed.  Constitutional:      General: She is not in acute distress.    Appearance: Normal appearance. She is not ill-appearing or toxic-appearing.  HENT:     Head: Normocephalic and  atraumatic.     Right Ear: Tympanic membrane, ear canal and external ear normal.     Left Ear: Tympanic membrane, ear canal and external ear normal.     Nose: Congestion and rhinorrhea present.     Mouth/Throat:     Mouth: Mucous membranes are moist.     Pharynx: Oropharynx is clear. Posterior oropharyngeal erythema (moderate clear PND. No tonsil enlargement) present.  Eyes:     General: No scleral icterus.       Right eye: No discharge.        Left eye: No discharge.     Conjunctiva/sclera: Conjunctivae normal.  Cardiovascular:     Rate and Rhythm: Normal rate and regular rhythm.     Heart sounds: Normal heart sounds.  Pulmonary:     Effort: Pulmonary effort is normal. No respiratory distress.     Breath sounds: Normal breath sounds. No wheezing, rhonchi or rales.  Musculoskeletal:     Cervical back: Neck supple.  Skin:    General: Skin is dry.  Neurological:     General: No focal deficit present.     Mental Status: She is alert. Mental status is at baseline.     Motor: No weakness.     Gait: Gait normal.  Psychiatric:        Mood and Affect: Mood normal.        Behavior: Behavior normal.        Thought Content: Thought content normal.      UC Treatments / Results  Labs (all labs ordered are listed, but only abnormal results are displayed) Labs Reviewed  GROUP A STREP BY PCR  SARS CORONAVIRUS 2 (TAT 6-24 HRS)    EKG   Radiology No results found.  Procedures Procedures (including critical care time)  Medications Ordered in UC Medications - No data to display  Initial Impression / Assessment and Plan / UC Course  I have reviewed the triage vital signs and the nursing notes.  Pertinent labs & imaging results that were available during my care of the patient were reviewed by me and considered in my medical decision making (see chart for details).   Rapid strep test negative.  All vital signs stable.  Patient afebrile.  Oxygen saturation 100%.  Covid test  obtained.  Advised how to access result.  CDC guidelines, isolation protocol and ED precautions discussed with patient.  At this time question viral illness versus allergies.  I sent a prescription for Bromfed cough syrup and Atrovent nasal spray.  Advised patient to rest increase fluid intake.  Follow-up with [place] or PCP if not better in 10 days from when symptoms started.  Follow-up sooner for any worsening symptoms.   Final Clinical Impressions(s) / UC Diagnoses   Final diagnoses:  Viral URI with cough  Sore throat  Post-nasal drip     Discharge Instructions     URI/COLD SYMPTOMS: Your exam today is consistent with a viral illness. Antibiotics are not indicated at this time. Use medications as directed, including cough syrup, nasal saline, and decongestants. Your symptoms should improve over the next few days and resolve within 7-10 days. Increase rest and fluids. F/u if symptoms worsen or predominate such as sore throat, ear pain, productive cough, shortness of breath, or if you develop high fevers or worsening fatigue over the next several days.    You have received COVID testing today either for positive exposure, concerning symptoms that could be related to COVID infection, screening purposes, or re-testing after confirmed positive.  Your test obtained today checks for active viral infection in the last 1-2 weeks. If your test is negative now, you can still test positive later. So, if you do develop symptoms you should either get re-tested and/or isolate x 10 days. Please follow CDC guidelines.  While Rapid antigen tests come back in 15-20 minutes, send out PCR/molecular test results typically come back within 24 hours. In the mean time, if you are symptomatic, assume this could be a positive test and treat/monitor yourself as if you do have COVID.   We will call with test results. Please download the MyChart app and set up a profile to access test results.   If symptomatic, go home and  rest. Push fluids. Take Tylenol as needed for discomfort. Gargle warm salt water. Throat lozenges. Take Mucinex DM or Robitussin for cough. Humidifier in bedroom to ease coughing. Warm showers. Also review the COVID handout for more information.  COVID-19 INFECTION: The incubation period of COVID-19 is approximately 14 days after exposure, with most symptoms developing in roughly 4-5 days. Symptoms may range in severity from mild to critically severe. Roughly 80% of those infected will have mild symptoms. People of any age may become infected with COVID-19 and have the ability to transmit the virus. The most common symptoms include: fever, fatigue, cough, body aches, headaches, sore throat, nasal congestion, shortness of breath, nausea, vomiting, diarrhea, changes in smell and/or taste.    COURSE OF ILLNESS Some patients may begin with mild disease which can progress quickly into critical symptoms. If your symptoms are worsening please call ahead to the Emergency Department and proceed there for further treatment. Recovery time appears to be roughly 1-2 weeks for mild symptoms and 3-6 weeks for severe disease.   GO IMMEDIATELY TO ER FOR FEVER YOU ARE UNABLE TO GET DOWN WITH TYLENOL, BREATHING PROBLEMS, CHEST PAIN, FATIGUE, LETHARGY, INABILITY TO EAT OR DRINK, ETC  QUARANTINE AND ISOLATION: To help decrease the spread of COVID-19 please remain isolated if you have COVID infection or are highly suspected to have COVID infection. This means -stay home and isolate to one room in the home if you live with others. Do not share a bed or bathroom with others while ill, sanitize and wipe down all countertops and keep common areas clean and disinfected. You may discontinue isolation if you have a mild case and are asymptomatic 10 days after symptom onset as long as you  have been fever free >24 hours without having to take Motrin or Tylenol. If your case is more severe (meaning you develop pneumonia or are admitted in  the hospital), you may have to isolate longer.   If you have been in close contact (within 6 feet) of someone diagnosed with COVID 19, you are advised to quarantine in your home for 14 days as symptoms can develop anywhere from 2-14 days after exposure to the virus. If you develop symptoms, you  must isolate.  Most current guidelines for COVID after exposure -isolate 10 days if you ARE NOT tested for COVID as long as symptoms do not develop -isolate 7 days if you are tested and remain asymptomatic -You do not necessarily need to be tested for COVID if you have + exposure and        develop   symptoms. Just isolate at home x10 days from symptom onset During this global pandemic, CDC advises to practice social distancing, try to stay at least 806ft away from others at all times. Wear a face covering. Wash and sanitize your hands regularly and avoid going anywhere that is not necessary.  KEEP IN MIND THAT THE COVID TEST IS NOT 100% ACCURATE AND YOU SHOULD STILL DO EVERYTHING TO PREVENT POTENTIAL SPREAD OF VIRUS TO OTHERS (WEAR MASK, WEAR GLOVES, WASH HANDS AND SANITIZE REGULARLY). IF INITIAL TEST IS NEGATIVE, THIS MAY NOT MEAN YOU ARE DEFINITELY NEGATIVE. MOST ACCURATE TESTING IS DONE 5-7 DAYS AFTER EXPOSURE.   It is not advised by CDC to get re-tested after receiving a positive COVID test since you can still test positive for weeks to months after you have already cleared the virus.   *If you have not been vaccinated for COVID, I strongly suggest you consider getting vaccinated as long as there are no contraindications.      ED Prescriptions    Medication Sig Dispense Auth. Provider   brompheniramine-pseudoephedrine-DM 30-2-10 MG/5ML syrup Take 10 mLs by mouth 4 (four) times daily as needed for up to 7 days. 150 mL Eusebio FriendlyEaves, Gjon Letarte B, PA-C   ipratropium (ATROVENT) 0.06 % nasal spray Place 2 sprays into both nostrils 4 (four) times daily for 7 days. 15 mL Shirlee LatchEaves, Brison Fiumara B, PA-C     PDMP not reviewed  this encounter.   Shirlee Latchaves, Deavin Forst B, PA-C 05/27/20 1252

## 2020-06-23 ENCOUNTER — Emergency Department
Admission: EM | Admit: 2020-06-23 | Discharge: 2020-06-23 | Disposition: A | Discharge status: Home / Self Care | Payer: 59 | Attending: Student in an Organized Health Care Education/Training Program | Admitting: Student in an Organized Health Care Education/Training Program

## 2020-06-23 ENCOUNTER — Emergency Department: Payer: 59

## 2020-06-23 ENCOUNTER — Other Ambulatory Visit: Payer: Self-pay

## 2020-06-23 DIAGNOSIS — Y248XXA Other firearm discharge, undetermined intent, initial encounter: Secondary | ICD-10-CM | POA: Diagnosis not present

## 2020-06-23 DIAGNOSIS — S61211A Laceration without foreign body of left index finger without damage to nail, initial encounter: Secondary | ICD-10-CM | POA: Insufficient documentation

## 2020-06-23 DIAGNOSIS — Z23 Encounter for immunization: Secondary | ICD-10-CM | POA: Insufficient documentation

## 2020-06-23 DIAGNOSIS — S61221A Laceration with foreign body of left index finger without damage to nail, initial encounter: Secondary | ICD-10-CM

## 2020-06-23 DIAGNOSIS — S60941A Unspecified superficial injury of left index finger, initial encounter: Secondary | ICD-10-CM | POA: Diagnosis present

## 2020-06-23 DIAGNOSIS — Y9359 Activity, other involving other sports and athletics played individually: Secondary | ICD-10-CM | POA: Insufficient documentation

## 2020-06-23 MED ORDER — SULFAMETHOXAZOLE-TRIMETHOPRIM 800-160 MG PO TABS: 1.0000 | ORAL_TABLET | Freq: Two times a day (BID) | ORAL | 0 refills | Status: AC

## 2020-06-23 MED ORDER — LIDOCAINE HCL (PF) 1 % IJ SOLN
10.0000 mL | Freq: Once | INTRAMUSCULAR | Status: DC
  Filled 2020-06-23: qty 10

## 2020-06-23 MED ORDER — TETANUS-DIPHTH-ACELL PERTUSSIS 5-2.5-18.5 LF-MCG/0.5 IM SUSY
0.5000 mL | PREFILLED_SYRINGE | Freq: Once | INTRAMUSCULAR | Status: AC
  Administered 2020-06-23: 0.5 mL via INTRAMUSCULAR
  Filled 2020-06-23: qty 0.5

## 2020-06-23 NOTE — ED Notes (Signed)
See triage note. Pt ambulatory to room. Pt with small laceration to left index finger. Bleeding controlled at this time. Pt denies pain but states numbness and tingling. Pt in NAD.

## 2020-06-23 NOTE — ED Provider Notes (Signed)
[facility] Emergency Department Provider Note  ____________________________________________  Time seen: Approximately 8:49 PM  I have reviewed the triage vital signs and the nursing notes.   HISTORY  Chief Complaint Extremity Laceration    HPI Joann Griffin is a 49 y.o. female who presents the emergency department complaining of a laceration to the left index finger.  Patient states that she was shooting a crossbow when the fathers from the bolt made contact with the finger.  Patient sustained a laceration.  She is still able to flex and extend the finger appropriately.  Unsure of last tetanus shot.  No other injury or complaint.  No medications prior to arrival.         Past Medical History:  Diagnosis Date  . Depression     There are no problems to display for this patient.   Past Surgical History:  Procedure Laterality Date  . CHOLECYSTECTOMY      Prior to Admission medications   Medication Sig Start Date End Date Taking? Authorizing Provider  acyclovir (ZOVIRAX) 200 MG capsule Take by mouth. 12/05/17   [provider]  cetirizine (ZYRTEC) 10 MG tablet Take 10 mg by mouth daily.    [provider]  cyclobenzaprine (FLEXERIL) 10 MG tablet Take 1 tablet (10 mg total) by mouth 2 (two) times daily as needed for muscle spasms. Do not drive while taking as can cause drowsiness 12/30/18   Renford Dills, NP  desvenlafaxine (PRISTIQ) 50 MG 24 hr tablet Take 50 mg by mouth daily.    [provider]  ipratropium (ATROVENT) 0.06 % nasal spray Place 2 sprays into both nostrils 4 (four) times daily for 7 days. 05/27/20 06/03/20  Shirlee Latch, PA-C  predniSONE (DELTASONE) 10 MG tablet Start 60 mg po day one, then 50 mg po day two, taper by 10 mg daily until complete. 12/30/18   Renford Dills, NP  sulfamethoxazole-trimethoprim (BACTRIM DS) 800-160 MG tablet Take 1 tablet by mouth 2 (two) times daily. 06/23/20   Natalea Sutliff, Delorise Royals,  PA-C    Allergies Patient has no known allergies.  Family History  Problem Relation Age of Onset  . Healthy Mother   . Diabetes Father   . Breast cancer Neg Hx     Social History Social History   Tobacco Use  . Smoking status: Never Smoker  . Smokeless tobacco: Never Used  Vaping Use  . Vaping Use: Every day  Substance Use Topics  . Alcohol use: Yes  . Drug use: Never     Review of Systems  Constitutional: No fever/chills Eyes: No visual changes. No discharge ENT: No upper respiratory complaints. Cardiovascular: no chest pain. Respiratory: no cough. No SOB. Gastrointestinal: No abdominal pain.  No nausea, no vomiting.   Musculoskeletal: Positive for laceration to the left index finger Skin: Negative for rash, abrasions, lacerations, ecchymosis. Neurological: Negative for headaches, focal weakness or numbness.  10 System ROS otherwise negative.  ____________________________________________   PHYSICAL EXAM:  VITAL SIGNS: ED Triage Vitals [06/23/20 2005]  Enc Vitals Group     BP 140/75     Pulse Rate 83     Resp 16     Temp 98.1 F (36.7 C)     Temp Source Oral     SpO2 99 %     Weight 180 lb (81.6 kg)     Height 5\' 8"  (1.727 m)     Head Circumference      Peak Flow  Pain Score 0     Pain Loc      Pain Edu?      Excl. in GC?      Constitutional: Alert and oriented. Well appearing and in no acute distress. Eyes: Conjunctivae are normal. PERRL. EOMI. Head: Atraumatic. ENT:      Ears:       Nose: No congestion/rhinnorhea.      Mouth/Throat: Mucous membranes are moist.  Neck: No stridor.    Cardiovascular: Normal rate, regular rhythm. Normal S1 and S2.  Good peripheral circulation. Respiratory: Normal respiratory effort without tachypnea or retractions. Lungs CTAB. Good air entry to the bases with no decreased or absent breath sounds. Musculoskeletal: Full range of motion to all extremities. No gross deformities appreciated.  Visualization of  the index finger reveals a somewhat ragged laceration along the lateral and dorsal aspect of the digit.  This extends from the distal phalanx to the DIP joint.  This does cross the DIP joint.  There is a retained foreign body which is also appreciated on x-ray.  This is successfully removed and the procedure below.  Patient has good range of motion to the digit.  Prior to the procedure good sensation and capillary refill.  Bleeding controlled with direct pressure.  No visible injury to the left hand. Neurologic:  Normal speech and language. No gross focal neurologic deficits are appreciated.  Skin:  Skin is warm, dry and intact. No rash noted. Psychiatric: Mood and affect are normal. Speech and behavior are normal. Patient exhibits appropriate insight and judgement.   ____________________________________________   LABS (all labs ordered are listed, but only abnormal results are displayed)  Labs Reviewed - No data to display ____________________________________________  EKG   ____________________________________________  RADIOLOGY I personally viewed and evaluated these images as part of my medical decision making, as well as reviewing the written report by the radiologist.  ED Provider Interpretation: Imaging reveals no acute osseous abnormality.  Findings consistent with foreign body appreciated on x-ray which is correlated with physical exam.  DG Finger Index Left  Result Date: 06/23/2020 CLINICAL DATA:  Deep laceration, crossbow injury EXAM: LEFT INDEX FINGER 2+V COMPARISON:  None. FINDINGS: Focal soft tissue swelling and laceration seen along the radial aspect of the first interphalangeal joint with some overlying radiodensity possibly reflecting debris external to the soft tissues. No soft tissue gas is seen. No associated osseous injury or traumatic malalignment. IMPRESSION: Focal soft tissue swelling and laceration along the radial aspect of the first interphalangeal joint with some  overlying radiodensity possibly reflecting debris external to the soft tissues. Correlate with visual inspection. No associated osseous abnormality. Electronically Signed   By: Kreg Shropshire M.D.   On: 06/23/2020 20:23    ____________________________________________    PROCEDURES  Procedure(s) performed:    Marland KitchenMarland KitchenLaceration Repair  Date/Time: 06/23/2020 9:46 PM Performed by: Racheal Patches, PA-C Authorized by: Racheal Patches, PA-C   Consent:    Consent obtained:  Verbal   Consent given by:  Patient   Risks discussed:  Pain, poor wound healing and infection Anesthesia (see MAR for exact dosages):    Anesthesia method:  Nerve block   Block location:  Index finger L hand   Block needle gauge:  27 G   Block anesthetic:  Lidocaine 1% w/o epi   Block technique:  Digital block   Block injection procedure:  Anatomic landmarks identified, introduced needle, negative aspiration for blood and incremental injection   Block outcome:  Anesthesia achieved Laceration details:  Location:  Finger   Finger location:  L index finger   Length (cm):  4 Repair type:    Repair type:  Intermediate Pre-procedure details:    Preparation:  Patient was prepped and draped in usual sterile fashion and imaging obtained to evaluate for foreign bodies Exploration:    Hemostasis achieved with:  Direct pressure   Wound exploration: wound explored through full range of motion and entire depth of wound probed and visualized     Wound extent: foreign bodies/material     Wound extent: no muscle damage noted, no nerve damage noted, no tendon damage noted, no underlying fracture noted and no vascular damage noted     Foreign bodies/material:  Feather from crossbow bolt   Contaminated: yes   Treatment:    Area cleansed with:  Betadine and saline   Amount of cleaning:  Extensive   Irrigation solution:  Sterile saline   Irrigation volume:  1 L   Irrigation method:  Syringe Skin repair:    Repair  method:  Sutures   Suture size:  4-0   Suture material:  Nylon   Suture technique:  Running locked   Number of sutures:  1 (1 running interlock suture with 11 throws) Approximation:    Approximation:  Close Post-procedure details:    Dressing:  Splint for protection   Patient tolerance of procedure:  Tolerated well, no immediate complications      Medications  lidocaine (PF) (XYLOCAINE) 1 % injection 10 mL (10 mLs Infiltration Handoff 06/23/20 2142)  Tdap (BOOSTRIX) injection 0.5 mL (0.5 mLs Intramuscular Given 06/23/20 2141)     ____________________________________________   INITIAL IMPRESSION / ASSESSMENT AND PLAN / ED COURSE  Pertinent labs & imaging results that were available during my care of the patient were reviewed by me and considered in my medical decision making (see chart for details).  Review of the [place] CSRS was performed in accordance of the [facility] prior to dispensing any controlled drugs.           Patient's diagnosis is consistent with laceration to the index finger of the left hand.  Patient presented to the emergency department after sustaining an injury to the index finger of the left hand.  Patient had a 4 cm laceration to the lateral and dorsal aspect of the finger.  Good range of motion.  Imaging revealed no underlying fracture but possible retained foreign body.  This was appreciated on exam.  Foreign body was successfully removed.  Copiously irrigated.  Wound closed as described above no complications.  Wound care instructions discussed with the patient.  Patient's tetanus shot was updated in the emergency department tonight.  Patient will be placed on antibiotics prophylactically.  Follow-up primary care in 1 week for suture removal.. Patient is given ED precautions to return to the ED for any worsening or new symptoms.     ____________________________________________  FINAL CLINICAL IMPRESSION(S) / ED DIAGNOSES  Final diagnoses:  Laceration of left  index finger with foreign body without damage to nail, initial encounter      NEW MEDICATIONS STARTED DURING THIS VISIT:  ED Discharge Orders         Ordered    sulfamethoxazole-trimethoprim (BACTRIM DS) 800-160 MG tablet  2 times daily        06/23/20 2152              This chart was dictated using voice recognition software/Dragon. Despite best efforts to proofread, errors can occur which can change the  meaning. Any change was purely unintentional.    Racheal PatchesCuthriell, Skylur Fuston D, PA-C 06/23/20 2158    Willy Eddyobinson, Patrick, MD 06/23/20 2303

## 2020-06-23 NOTE — ED Triage Notes (Signed)
PT was hunting with a crossbow, was holding it wrong and when she went to shoot it, the feathers cut the upper half of her L pointer finger in the bend of the upper joint. Small amount of dried blood, scant amount of active bleeding.

## 2020-07-06 ENCOUNTER — Ambulatory Visit
Admission: EM | Admit: 2020-07-06 | Discharge: 2020-07-06 | Disposition: A | Discharge status: Home / Self Care | Payer: 59 | Attending: Family Medicine | Admitting: Family Medicine

## 2020-07-06 ENCOUNTER — Other Ambulatory Visit: Payer: Self-pay

## 2020-07-06 ENCOUNTER — Encounter: Payer: Self-pay | Admitting: Emergency Medicine

## 2020-07-06 DIAGNOSIS — T148XXD Other injury of unspecified body region, subsequent encounter: Secondary | ICD-10-CM | POA: Diagnosis not present

## 2020-07-06 NOTE — ED Triage Notes (Signed)
Patient states that she had sutures placed on 06/23/20 and has had her sutures taken out.  Patient c/o redness and swelling and yellow drainage from her left 2nd finger that has gotten worse since her sutures were removed.  Patient states that the suture site has opened up and not completely healed.

## 2020-07-06 NOTE — ED Provider Notes (Signed)
[facility]    CSN: [id number] Arrival date & time: 07/06/20  1342      History   Chief Complaint Chief Complaint  Patient presents with  . Wound Infection  . Extremity Laceration    repaired   HPI  49 year old female presents for evaluation of a wound that was recently repaired at [facility] regional.  Patient suffered a laceration of her left index finger on 10/25.  Was repaired with a running suture.  Patient states that her and her husband removed the suture on Friday.  She states that there has been some slight drainage from the wound.  It is still tender.  She states that it has not fully healed.  She states that she believes that it is not healed as the suture became loose and started to unravel.  Mild pain at this time.  Patient concern for wound infection.  No other complaints.  Past Medical History:  Diagnosis Date  . Depression     There are no problems to display for this patient.   Past Surgical History:  Procedure Laterality Date  . CHOLECYSTECTOMY      OB History   No obstetric history on file.      Home Medications    Prior to Admission medications   Medication Sig Start Date End Date Taking? Authorizing Provider  desvenlafaxine (PRISTIQ) 50 MG 24 hr tablet Take 50 mg by mouth daily.   Yes [provider]  acyclovir (ZOVIRAX) 200 MG capsule Take by mouth. 12/05/17   [provider]  cetirizine (ZYRTEC) 10 MG tablet Take 10 mg by mouth daily.    [provider]  cyclobenzaprine (FLEXERIL) 10 MG tablet Take 1 tablet (10 mg total) by mouth 2 (two) times daily as needed for muscle spasms. Do not drive while taking as can cause drowsiness 12/30/18   Renford Dills, NP  ipratropium (ATROVENT) 0.06 % nasal spray Place 2 sprays into both nostrils 4 (four) times daily for 7 days. 05/27/20 06/03/20  Shirlee Latch, PA-C  predniSONE (DELTASONE) 10 MG tablet Start 60 mg po day one, then 50 mg po day two, taper by 10 mg daily  until complete. 12/30/18   Renford Dills, NP  sulfamethoxazole-trimethoprim (BACTRIM DS) 800-160 MG tablet Take 1 tablet by mouth 2 (two) times daily. 06/23/20   Cuthriell, Delorise Royals, PA-C    Family History Family History  Problem Relation Age of Onset  . Healthy Mother   . Diabetes Father   . Breast cancer Neg Hx     Social History Social History   Tobacco Use  . Smoking status: Never Smoker  . Smokeless tobacco: Never Used  Vaping Use  . Vaping Use: Every day  Substance Use Topics  . Alcohol use: Yes  . Drug use: Never     Allergies   Patient has no known allergies.   Review of Systems Review of Systems  Skin: Positive for wound.   Physical Exam Triage Vital Signs ED Triage Vitals  Enc Vitals Group     BP 07/06/20 1410 132/89     Pulse Rate 07/06/20 1410 76     Resp 07/06/20 1410 14     Temp 07/06/20 1410 98 F (36.7 C)     Temp Source 07/06/20 1410 Oral     SpO2 07/06/20 1410 98 %     Weight 07/06/20 1407 180 lb (81.6 kg)     Height 07/06/20 1407 5\' 8"  (1.727 m)     Head  Circumference --      Peak Flow --      Pain Score 07/06/20 1407 5     Pain Loc --      Pain Edu? --      Excl. in GC? --    Updated Vital Signs BP 132/89 (BP Location: Right Arm)   Pulse 76   Temp 98 F (36.7 C) (Oral)   Resp 14   Ht 5\' 8"  (1.727 m)   Wt 81.6 kg   SpO2 98%   BMI 27.37 kg/m   Visual Acuity Right Eye Distance:   Left Eye Distance:   Bilateral Distance:    Right Eye Near:   Left Eye Near:    Bilateral Near:     Physical Exam Vitals and nursing note reviewed.  Constitutional:      General: She is not in acute distress.    Appearance: Normal appearance. She is not ill-appearing.  Skin:    Comments: Left index finger -slightly tender to palpation.  No significant surrounding erythema.  No drainage in the wound.  It is not warm.  The wound is still open and has not fully healed.  Neurological:     Mental Status: She is alert.    UC Treatments /  Results  Labs (all labs ordered are listed, but only abnormal results are displayed) Labs Reviewed - No data to display  EKG   Radiology No results found.  Procedures Procedures (including critical care time)  Medications Ordered in UC Medications - No data to display  Initial Impression / Assessment and Plan / UC Course  I have reviewed the triage vital signs and the nursing notes.  Pertinent labs & imaging results that were available during my care of the patient were reviewed by me and considered in my medical decision making (see chart for details).    49 year old female presents with a wound that has not yet healed.  Wound healing delayed at this time.  There is no evidence of infection.  Steri-Strips applied.  Advised continued observation.  Needs additional time to heal.  Final Clinical Impressions(s) / UC Diagnoses   Final diagnoses:  Wound healing, delayed   Discharge Instructions   None    ED Prescriptions    None     PDMP not reviewed this encounter.   59, DO 07/06/20 1440

## 2020-08-06 ENCOUNTER — Other Ambulatory Visit: Payer: Self-pay

## 2020-08-06 ENCOUNTER — Ambulatory Visit: Admission: EM | Admit: 2020-08-06 | Discharge: 2020-08-06 | Discharge status: Against Medical Advice | Payer: 59

## 2020-08-07 ENCOUNTER — Ambulatory Visit
Admission: EM | Admit: 2020-08-07 | Discharge: 2020-08-07 | Disposition: A | Discharge status: Home / Self Care | Payer: 59 | Attending: Family Medicine | Admitting: Family Medicine

## 2020-08-07 ENCOUNTER — Ambulatory Visit: Payer: 59

## 2020-08-07 DIAGNOSIS — R109 Unspecified abdominal pain: Secondary | ICD-10-CM | POA: Diagnosis not present

## 2020-08-07 DIAGNOSIS — L03012 Cellulitis of left finger: Secondary | ICD-10-CM | POA: Insufficient documentation

## 2020-08-07 LAB — URINALYSIS, COMPLETE (UACMP) WITH MICROSCOPIC
Bilirubin Urine: NEGATIVE
Glucose, UA: NEGATIVE mg/dL
Ketones, ur: NEGATIVE mg/dL
Leukocytes,Ua: NEGATIVE
Nitrite: NEGATIVE
Protein, ur: NEGATIVE mg/dL
Specific Gravity, Urine: <1.005 — ABNORMAL LOW (ref 1.005–1.030)
pH: 5.5 (ref 5.0–8.0)

## 2020-08-07 MED ORDER — CEPHALEXIN 500 MG PO CAPS: 500.0000 mg | ORAL_CAPSULE | Freq: Three times a day (TID) | ORAL | 0 refills | Status: AC

## 2020-08-07 MED ORDER — KETOROLAC TROMETHAMINE 10 MG PO TABS: 10.0000 mg | ORAL_TABLET | Freq: Four times a day (QID) | ORAL | 0 refills | Status: AC | PRN

## 2020-08-07 MED ORDER — TAMSULOSIN HCL 0.4 MG PO CAPS: 0.4000 mg | ORAL_CAPSULE | Freq: Every day | ORAL | 0 refills | Status: AC

## 2020-08-07 NOTE — Discharge Instructions (Addendum)
Take the Toradol every 6 hours as needed for pain.  Take it with food.  Take the tamsulosin once a day.  Increase oral fluid intake to increase urine production help flush your urinary system.  If your symptoms worsen either return for reevaluation, see your primary care provider, or go to the ER.

## 2020-08-07 NOTE — ED Provider Notes (Addendum)
[facility]    CSN: [id number] Arrival date & time: 08/07/20  1245      History   Chief Complaint Chief Complaint  Patient presents with  . Urinary Tract Infection    HPI Joann Griffin is a 49 y.o. female.   HPI   49 year old female here for evaluation of right flank pain and increased urinary frequency.  Patient reports that she been having pain for the past couple of days in her right flank and that it is radiating around to her right lower quadrant.  Patient denies pain with urination.  She has had urinary urgency.  She denies any fever, blood in urine, any injury, numbness or tingling her legs.  Patient does report this does heavy lifting at work.  Patient reports that she is having pain in her low back right greater than left.  Of a secondary nature patient is complaining of redness, pain, swelling, and tenderness to her left index finger.  Patient received a laceration from a crossbow string, was evaluated in the ER, was sutured, and was sent home.  Patient reports that she was not been on any antibiotics.  She had her sutures removed on member fifth 2021.  Patient's had redness and swelling to her index finger for the last few days.  Patient reports that she thinks some fluid has come out.  Patient denies fever.  Past Medical History:  Diagnosis Date  . Depression     There are no problems to display for this patient.   Past Surgical History:  Procedure Laterality Date  . CHOLECYSTECTOMY      OB History   No obstetric history on file.      Home Medications    Prior to Admission medications   Medication Sig Start Date End Date Taking? Authorizing Provider  acyclovir (ZOVIRAX) 200 MG capsule Take by mouth. 12/05/17   [provider]  cephALEXin (KEFLEX) 500 MG capsule Take 1 capsule (500 mg total) by mouth 3 (three) times daily for 7 days. 08/07/20 08/14/20  Becky Augusta, NP  cetirizine (ZYRTEC) 10 MG tablet Take 10 mg by mouth daily.     [provider]  cyclobenzaprine (FLEXERIL) 10 MG tablet Take 1 tablet (10 mg total) by mouth 2 (two) times daily as needed for muscle spasms. Do not drive while taking as can cause drowsiness 12/30/18   Renford Dills, NP  desvenlafaxine (PRISTIQ) 50 MG 24 hr tablet Take 50 mg by mouth daily.    [provider]  ipratropium (ATROVENT) 0.06 % nasal spray Place 2 sprays into both nostrils 4 (four) times daily for 7 days. 05/27/20 06/03/20  Eusebio Friendly B, PA-C  ketorolac (TORADOL) 10 MG tablet Take 1 tablet (10 mg total) by mouth every 6 (six) hours as needed. 08/07/20   Becky Augusta, NP  predniSONE (DELTASONE) 10 MG tablet Start 60 mg po day one, then 50 mg po day two, taper by 10 mg daily until complete. 12/30/18   Renford Dills, NP  sulfamethoxazole-trimethoprim (BACTRIM DS) 800-160 MG tablet Take 1 tablet by mouth 2 (two) times daily. 06/23/20   Cuthriell, Delorise Royals, PA-C  tamsulosin (FLOMAX) 0.4 MG CAPS capsule Take 1 capsule (0.4 mg total) by mouth daily. 08/07/20   Becky Augusta, NP    Family History Family History  Problem Relation Age of Onset  . Healthy Mother   . Diabetes Father   . Breast cancer Neg Hx     Social History Social History   Tobacco  Use  . Smoking status: Never Smoker  . Smokeless tobacco: Never Used  Vaping Use  . Vaping Use: Every day  Substance Use Topics  . Alcohol use: Yes  . Drug use: Never     Allergies   Patient has no known allergies.   Review of Systems Review of Systems  Constitutional: Negative for activity change, appetite change and fever.  Gastrointestinal: Negative for abdominal pain, diarrhea, nausea and vomiting.  Genitourinary: Positive for flank pain and frequency. Negative for dysuria, hematuria and urgency.  Musculoskeletal: Positive for back pain. Negative for gait problem.  Skin: Positive for color change.  Neurological: Negative for weakness and numbness.  Psychiatric/Behavioral: Negative.      Physical  Exam Triage Vital Signs ED Triage Vitals [08/07/20 0859]  Enc Vitals Group     BP (!) 126/107     Pulse Rate 72     Resp 18     Temp 98.3 F (36.8 C)     Temp Source Oral     SpO2 98 %     Weight 180 lb (81.6 kg)     Height 5\' 8"  (1.727 m)     Head Circumference      Peak Flow      Pain Score 6     Pain Loc      Pain Edu?      Excl. in GC?    No data found.  Updated Vital Signs BP (!) 126/107   Pulse 72   Temp 98.3 F (36.8 C) (Oral)   Resp 18   Ht 5\' 8"  (1.727 m)   Wt 180 lb (81.6 kg)   SpO2 98%   BMI 27.37 kg/m   Visual Acuity Right Eye Distance:   Left Eye Distance:   Bilateral Distance:    Right Eye Near:   Left Eye Near:    Bilateral Near:     Physical Exam Vitals and nursing note reviewed.  Constitutional:      General: She is not in acute distress.    Appearance: Normal appearance. She is not toxic-appearing.  HENT:     Head: Normocephalic and atraumatic.  Cardiovascular:     Rate and Rhythm: Normal rate and regular rhythm.     Pulses: Normal pulses.     Heart sounds: Normal heart sounds. No murmur heard. No gallop.   Pulmonary:     Effort: Pulmonary effort is normal.     Breath sounds: Normal breath sounds. No wheezing or rales.  Abdominal:     General: Bowel sounds are normal.     Palpations: Abdomen is soft.     Tenderness: There is no abdominal tenderness. There is right CVA tenderness. There is no left CVA tenderness, guarding or rebound.  Musculoskeletal:        General: Swelling and tenderness present. Normal range of motion.  Skin:    General: Skin is warm and dry.     Capillary Refill: Capillary refill takes less than 2 seconds.     Findings: Erythema present. No rash.  Neurological:     General: No focal deficit present.     Mental Status: She is alert and oriented to person, place, and time.  Psychiatric:        Mood and Affect: Mood normal.        Behavior: Behavior normal.        Thought Content: Thought content normal.         Judgment: Judgment normal.  UC Treatments / Results  Labs (all labs ordered are listed, but only abnormal results are displayed) Labs Reviewed  URINALYSIS, COMPLETE (UACMP) WITH MICROSCOPIC - Abnormal; Notable for the following components:      Result Value   Specific Gravity, Urine <1.005 (*)    Hgb urine dipstick TRACE (*)    Bacteria, UA FEW (*)    All other components within normal limits  URINE CULTURE    EKG   Radiology No results found.  Procedures Procedures (including critical care time)  Medications Ordered in UC Medications - No data to display  Initial Impression / Assessment and Plan / UC Course  I have reviewed the triage vital signs and the nursing notes.  Pertinent labs & imaging results that were available during my care of the patient were reviewed by me and considered in my medical decision making (see chart for details).   Here for evaluation of what she calls right low back pain and increased urinary frequency.  Patient has no pain with urination but does have urgency.  Patient denies any blood in her urine or fever.  Patient describes more right flank pain and describes it radiating around to the right lower quadrant of her abdomen.  On physical exam patient does have right CVA tenderness.  Abdomen is soft, nontender, nondistended with positive bowel sounds in all 4 quadrants.  Also collected at triage that shows trace blood, no leukocytes, no nitrates, no WBCs, 6-10 squamous, and few bacteria.  Patient reports that her pain is better today.  UA does not resemble UTI, concern for possible renal stone versus lumbar back strain.  Will obtain KUB to look for stone.  Patient states that she is unwilling to wait on x-ray.  Will prescribe Toradol and tamsulosin and have patient return to the ER for symptoms worsen.  Will send UA for culture.  With regards to patient's left index finger, the area is red, warm, tender, and swollen.  No fluid collection  appreciated on exam.  Exam is consistent with cellulitis.  Will treat with Keflex 3 times daily x7 days.  Final Clinical Impressions(s) / UC Diagnoses   Final diagnoses:  Right flank pain  Cellulitis of index finger, left     Discharge Instructions     Take the Toradol every 6 hours as needed for pain.  Take it with food.  Take the tamsulosin once a day.  Increase oral fluid intake to increase urine production help flush your urinary system.  If your symptoms worsen either return for reevaluation, see your primary care provider, or go to the ER.    ED Prescriptions    Medication Sig Dispense Auth. Provider   ketorolac (TORADOL) 10 MG tablet Take 1 tablet (10 mg total) by mouth every 6 (six) hours as needed. 20 tablet Becky Augusta, NP   tamsulosin (FLOMAX) 0.4 MG CAPS capsule Take 1 capsule (0.4 mg total) by mouth daily. 30 capsule Becky Augusta, NP   cephALEXin (KEFLEX) 500 MG capsule Take 1 capsule (500 mg total) by mouth 3 (three) times daily for 7 days. 21 capsule Becky Augusta, NP     PDMP not reviewed this encounter.   Becky Augusta, NP 08/07/20 1002    Becky Augusta, NP 08/07/20 1007

## 2020-08-07 NOTE — ED Triage Notes (Signed)
Pt reports having low back pain that began "a couple days ago". Also reports having urinary frequency. Denies pain or burning with urination.  Pt had sutures on L index finger in Oct. Sutures were removed on Nov 5. sts now the finger is painful, red, and some swelling.

## 2020-08-08 LAB — URINE CULTURE
Culture: NO GROWTH
Special Requests: NORMAL

## 2020-10-17 ENCOUNTER — Other Ambulatory Visit: Payer: Self-pay | Admitting: Family Medicine

## 2020-11-14 ENCOUNTER — Ambulatory Visit
Admission: RE | Admit: 2020-11-14 | Discharge: 2020-11-14 | Disposition: A | Discharge status: Home / Self Care | Payer: 59 | Source: Ambulatory Visit

## 2020-11-14 ENCOUNTER — Other Ambulatory Visit: Payer: Self-pay

## 2020-11-14 ENCOUNTER — Ambulatory Visit (INDEPENDENT_AMBULATORY_CARE_PROVIDER_SITE_OTHER): Payer: 59

## 2020-11-14 VITALS — BP 123/77 | HR 67 | Temp 98.1°F | Resp 18 | Ht 68.0 in | Wt 180.0 lb

## 2020-11-14 DIAGNOSIS — S62635A Displaced fracture of distal phalanx of left ring finger, initial encounter for closed fracture: Secondary | ICD-10-CM | POA: Diagnosis not present

## 2020-11-14 DIAGNOSIS — S62661A Nondisplaced fracture of distal phalanx of left index finger, initial encounter for closed fracture: Secondary | ICD-10-CM | POA: Diagnosis not present

## 2020-11-14 DIAGNOSIS — W230XXA Caught, crushed, jammed, or pinched between moving objects, initial encounter: Secondary | ICD-10-CM

## 2020-11-14 HISTORY — DX: Other seasonal allergic rhinitis: J30.2

## 2020-11-14 NOTE — Discharge Instructions (Addendum)
You have a fracture of the distal or end bone of the finger. It is broken in more than one place. This may heal fine on its own or require surgical repair or physical therapy. Follow up with ortho evaluation.   You have a condition requiring you to follow up with Orthopedics so please call one of the following office for appointment:   [facility] [address] [place], [place] [address] Phone: [phone]  [facility] [address] [place], [place] [address] Phone: [phone]

## 2020-11-14 NOTE — ED Provider Notes (Signed)
[facility]    CSN: [id number] Arrival date & time: 11/14/20  0851      History   Chief Complaint Chief Complaint  Patient presents with  . Appointment  . Finger Injury    Left ring    HPI Joann Griffin is a 50 y.o. female presenting for pain and numbness of the distal phalanx of the left ring finger.  Patient says that 3 weeks ago she injured the finger by accidentally sticking it in a leaf blower fan.  She says she has had persistent bruising, swelling and pain since.  She says that she is approximately 90% back to normal though.  Patient concerned because it still feels a lot of "pressure" at the end of the finger.  She also reports continued numbness, but says that it has improved.  She has iced it a lot.  Patient also wore a bandage for period of time.  She denies any weakness of the finger.  Has not been taking anything for pain relief.  No other injuries, complaints or concerns.  HPI  Past Medical History:  Diagnosis Date  . Depression   . Seasonal allergies     There are no problems to display for this patient.   Past Surgical History:  Procedure Laterality Date  . CHOLECYSTECTOMY      OB History   No obstetric history on file.      Home Medications    Prior to Admission medications   Medication Sig Start Date End Date Taking? Authorizing Provider  acyclovir (ZOVIRAX) 200 MG capsule Take by mouth. 12/05/17  Yes [provider]  cetirizine (ZYRTEC) 10 MG tablet Take 10 mg by mouth daily.   Yes [provider]  desvenlafaxine (PRISTIQ) 50 MG 24 hr tablet Take 50 mg by mouth daily.   Yes [provider]  ipratropium (ATROVENT) 0.06 % nasal spray Place 2 sprays into both nostrils 4 (four) times daily for 7 days. 05/27/20 06/03/20 Yes Eusebio Friendly B, PA-C  montelukast (SINGULAIR) 10 MG tablet Take 10 mg by mouth daily. 10/16/20  Yes [provider]  cyclobenzaprine (FLEXERIL) 10 MG tablet Take 1 tablet (10 mg total)  by mouth 2 (two) times daily as needed for muscle spasms. Do not drive while taking as can cause drowsiness 12/30/18   Renford Dills, NP  ketorolac (TORADOL) 10 MG tablet Take 1 tablet (10 mg total) by mouth every 6 (six) hours as needed. 08/07/20   Becky Augusta, NP  predniSONE (DELTASONE) 10 MG tablet Start 60 mg po day one, then 50 mg po day two, taper by 10 mg daily until complete. 12/30/18   Renford Dills, NP  sulfamethoxazole-trimethoprim (BACTRIM DS) 800-160 MG tablet Take 1 tablet by mouth 2 (two) times daily. 06/23/20   Cuthriell, Delorise Royals, PA-C  tamsulosin (FLOMAX) 0.4 MG CAPS capsule Take 1 capsule (0.4 mg total) by mouth daily. 08/07/20   Becky Augusta, NP    Family History Family History  Problem Relation Age of Onset  . Heart disease Mother   . Hypertension Mother   . Diabetes Father   . Breast cancer Neg Hx     Social History Social History   Tobacco Use  . Smoking status: Never Smoker  . Smokeless tobacco: Never Used  Vaping Use  . Vaping Use: Every day  . Substances: Nicotine, Flavoring  Substance Use Topics  . Alcohol use: Yes    Comment: 10-15 beers per week  . Drug use: Never  Allergies   Patient has no known allergies.   Review of Systems Review of Systems  Constitutional: Negative for fever.  Musculoskeletal: Positive for arthralgias. Negative for joint swelling.  Skin: Negative for color change and wound.  Neurological: Positive for numbness. Negative for weakness.  Hematological: Does not bruise/bleed easily.     Physical Exam Triage Vital Signs ED Triage Vitals  Enc Vitals Group     BP 11/14/20 0916 123/77     Pulse Rate 11/14/20 0916 67     Resp 11/14/20 0916 18     Temp 11/14/20 0916 98.1 F (36.7 C)     Temp Source 11/14/20 0916 Oral     SpO2 11/14/20 0916 100 %     Weight 11/14/20 0916 180 lb (81.6 kg)     Height 11/14/20 0916 5\' 8"  (1.727 m)     Head Circumference --      Peak Flow --      Pain Score 11/14/20 0915 2     Pain  Loc --      Pain Edu? --      Excl. in GC? --    No data found.  Updated Vital Signs BP 123/77 (BP Location: Right Arm)   Pulse 67   Temp 98.1 F (36.7 C) (Oral)   Resp 18   Ht 5\' 8"  (1.727 m)   Wt 180 lb (81.6 kg)   LMP 10/04/2020 (Exact Date)   SpO2 100%   BMI 27.37 kg/m       Physical Exam Vitals and nursing note reviewed.  Constitutional:      General: She is not in acute distress.    Appearance: Normal appearance. She is not ill-appearing or toxic-appearing.  HENT:     Head: Normocephalic and atraumatic.  Eyes:     General: No scleral icterus.       Right eye: No discharge.        Left eye: No discharge.     Conjunctiva/sclera: Conjunctivae normal.  Cardiovascular:     Rate and Rhythm: Normal rate and regular rhythm.     Pulses: Normal pulses.  Pulmonary:     Effort: Pulmonary effort is normal. No respiratory distress.  Musculoskeletal:     Cervical back: Neck supple.     Comments: Left index finger: There is minor ecchymosis of the medial nail and distal phalanx.  Mild swelling of the distal phalanx.  TTP distal phalanx.  Full range of motion of the MCP, PIP and DIP joints.  Good strength and sensation.  Skin:    General: Skin is dry.  Neurological:     General: No focal deficit present.     Mental Status: She is alert. Mental status is at baseline.     Motor: No weakness.     Gait: Gait normal.  Psychiatric:        Mood and Affect: Mood normal.        Behavior: Behavior normal.        Thought Content: Thought content normal.      UC Treatments / Results  Labs (all labs ordered are listed, but only abnormal results are displayed) Labs Reviewed - No data to display  EKG   Radiology DG Finger Ring Left  Result Date: 11/14/2020 CLINICAL DATA:  50 year old female status post crush injury 3 weeks ago, persistent pain and numbness. EXAM: LEFT RING FINGER 2+V COMPARISON:  Left index finger only in 06/23/2020. FINDINGS: Comminuted and unhealed fracture  of the tuft of the  left 4th distal phalanx with mild peripherally displaced comminution fragments. This appears extra-articular, and the 4th D IP appears intact. Regional soft tissue swelling. No soft tissue gas. No other osseous abnormality. IMPRESSION: Comminuted fracture of the tuft of the left 4th distal phalanx. Electronically Signed   By: Odessa Fleming M.D.   On: 11/14/2020 09:47    Procedures Procedures (including critical care time)  Medications Ordered in UC Medications - No data to display  Initial Impression / Assessment and Plan / UC Course  I have reviewed the triage vital signs and the nursing notes.  Pertinent labs & imaging results that were available during my care of the patient were reviewed by me and considered in my medical decision making (see chart for details).   50 year old female presenting for injury of the left ring finger about 3 weeks ago with persistent pain, swelling, bruising and numbness.  Does report that symptoms are improving.  X-ray of left index finger obtained today does reveal fracture of the distal phalanx. Overread states there is a comminuted fracture of distal phalanx. Reviewed results with patient and advised of 2 options--Advised supportive care at home with continuing to monitor condition. And treating with RICE and NSAIDs/Tylenol for pain and at home exercises vs consult with orthopedics. Patient provided with imaging on a disc and contact info for ortho. Advised would seek ortho consult since she has the continued numbness. F/u w/ our dept as needed.    Final Clinical Impressions(s) / UC Diagnoses   Final diagnoses:  Closed nondisplaced fracture of distal phalanx of left index finger, initial encounter     Discharge Instructions     You have a fracture of the distal or end bone of the finger. It is broken in more than one place. This may heal fine on its own or require surgical repair or physical therapy. Follow up with ortho evaluation.   You  have a condition requiring you to follow up with Orthopedics so please call one of the following office for appointment:   [facility] [address] [place], [place] [address] Phone: [phone]  [facility] [address], [place], [place] [address] Phone: [phone]     ED Prescriptions    None     PDMP not reviewed this encounter.   Shirlee Latch, PA-C 11/14/20 1017

## 2020-11-14 NOTE — ED Triage Notes (Signed)
Patient in today c/o left ring finger injury 3 weeks ago (DOI 10/24/20). Patient states she stuck her finger in the actual fan of a leaf blower and has since had pain and numbness since. Patient has taken OTC Ibuprofen.

## 2021-10-29 ENCOUNTER — Other Ambulatory Visit: Payer: Self-pay | Admitting: Family Medicine

## 2021-10-29 DIAGNOSIS — Z1231 Encounter for screening mammogram for malignant neoplasm of breast: Secondary | ICD-10-CM

## 2021-11-18 ENCOUNTER — Ambulatory Visit
Admission: RE | Admit: 2021-11-18 | Discharge: 2021-11-18 | Disposition: A | Discharge status: Home / Self Care | Payer: 59 | Source: Ambulatory Visit | Attending: Family Medicine | Admitting: Family Medicine

## 2021-11-18 ENCOUNTER — Other Ambulatory Visit: Payer: Self-pay

## 2021-11-18 DIAGNOSIS — Z1231 Encounter for screening mammogram for malignant neoplasm of breast: Secondary | ICD-10-CM | POA: Diagnosis present

## 2022-04-17 ENCOUNTER — Encounter: Payer: Self-pay | Admitting: Emergency Medicine

## 2022-04-17 ENCOUNTER — Ambulatory Visit
Admission: EM | Admit: 2022-04-17 | Discharge: 2022-04-17 | Disposition: A | Discharge status: Home / Self Care | Payer: 59 | Attending: Emergency Medicine | Admitting: Emergency Medicine

## 2022-04-17 DIAGNOSIS — L232 Allergic contact dermatitis due to cosmetics: Secondary | ICD-10-CM | POA: Diagnosis not present

## 2022-04-17 MED ORDER — PREDNISONE 10 MG (21) PO TBPK: ORAL_TABLET | Freq: Every day | ORAL | 0 refills | Status: AC

## 2022-04-17 NOTE — ED Provider Notes (Signed)
[facility]    CSN: [id number] Arrival date & time: 04/17/22  1002      History   Chief Complaint Chief Complaint  Patient presents with   Rash    HPI Joann Griffin is a 51 y.o. female.   Patient presents with rash to the left upper extremity beginning 5 days ago after application of Aquaphor.  Endorses that she got a tattoo within the last 4 weeks that has begun to peel and she is attempted use of Aquaphor to help with the dryness.  Had a similar reaction to A&E ointment.  Rash is erythematous and pruritic but endorses she has been attempting not to scratch.  Has attempted use of calamine lotion which has been minimally helpful.  Denies drainage, fever or chills.  Past Medical History:  Diagnosis Date   Depression    Seasonal allergies     There are no problems to display for this patient.   Past Surgical History:  Procedure Laterality Date   CHOLECYSTECTOMY      OB History   No obstetric history on file.      Home Medications    Prior to Admission medications   Medication Sig Start Date End Date Taking? Authorizing Provider  cetirizine (ZYRTEC) 10 MG tablet Take 10 mg by mouth daily.   Yes [provider]  desvenlafaxine (PRISTIQ) 50 MG 24 hr tablet Take 50 mg by mouth daily.   Yes [provider]  predniSONE (STERAPRED UNI-PAK 21 TAB) 10 MG (21) TBPK tablet Take by mouth daily. Take 6 tabs by mouth daily  for 2 days, then 5 tabs for 2 days, then 4 tabs for 2 days, then 3 tabs for 2 days, 2 tabs for 2 days, then 1 tab by mouth daily for 2 days 04/17/22  Yes Monterio Bob R, NP  acyclovir (ZOVIRAX) 200 MG capsule Take by mouth. 12/05/17   [provider]  cyclobenzaprine (FLEXERIL) 10 MG tablet Take 1 tablet (10 mg total) by mouth 2 (two) times daily as needed for muscle spasms. Do not drive while taking as can cause drowsiness 12/30/18   Renford Dills, NP  ipratropium (ATROVENT) 0.06 % nasal spray Place 2 sprays into both  nostrils 4 (four) times daily for 7 days. 05/27/20 06/03/20  Eusebio Friendly B, PA-C  ketorolac (TORADOL) 10 MG tablet Take 1 tablet (10 mg total) by mouth every 6 (six) hours as needed. 08/07/20   Becky Augusta, NP  montelukast (SINGULAIR) 10 MG tablet Take 10 mg by mouth daily. 10/16/20   [provider]  sulfamethoxazole-trimethoprim (BACTRIM DS) 800-160 MG tablet Take 1 tablet by mouth 2 (two) times daily. 06/23/20   Cuthriell, Delorise Royals, PA-C  tamsulosin (FLOMAX) 0.4 MG CAPS capsule Take 1 capsule (0.4 mg total) by mouth daily. 08/07/20   Becky Augusta, NP    Family History Family History  Problem Relation Age of Onset   Heart disease Mother    Hypertension Mother    Diabetes Father    Breast cancer Neg Hx     Social History Social History   Tobacco Use   Smoking status: Never   Smokeless tobacco: Never  Vaping Use   Vaping Use: Every day   Substances: Nicotine, Flavoring  Substance Use Topics   Alcohol use: Yes    Comment: 10-15 beers per week   Drug use: Never     Allergies   Patient has no known allergies.   Review of Systems Review of  Systems  Constitutional: Negative.   Respiratory: Negative.    Cardiovascular: Negative.   Skin:  Positive for rash. Negative for color change, pallor and wound.  Neurological: Negative.      Physical Exam Triage Vital Signs ED Triage Vitals  Enc Vitals Group     BP 04/17/22 1015 (!) 133/93     Pulse Rate 04/17/22 1015 69     Resp 04/17/22 1015 14     Temp 04/17/22 1015 98.4 F (36.9 C)     Temp Source 04/17/22 1015 Oral     SpO2 04/17/22 1015 97 %     Weight 04/17/22 1013 190 lb (86.2 kg)     Height 04/17/22 1013 5\' 8"  (1.727 m)     Head Circumference --      Peak Flow --      Pain Score 04/17/22 1013 0     Pain Loc --      Pain Edu? --      Excl. in [place]? --    No data found.  Updated Vital Signs BP (!) 133/93 (BP Location: Right Arm)   Pulse 69   Temp 98.4 F (36.9 C) (Oral)   Resp 14   Ht 5\' 8"  (1.727  m)   Wt 190 lb (86.2 kg)   SpO2 97%   BMI 28.89 kg/m   Visual Acuity Right Eye Distance:   Left Eye Distance:   Bilateral Distance:    Right Eye Near:   Left Eye Near:    Bilateral Near:     Physical Exam Constitutional:      Appearance: Normal appearance.  Eyes:     Extraocular Movements: Extraocular movements intact.  Pulmonary:     Effort: Pulmonary effort is normal.  Skin:    Comments: Defer to photo below  Neurological:     Mental Status: She is alert and oriented to person, place, and time. Mental status is at baseline.  Psychiatric:        Mood and Affect: Mood normal.        Behavior: Behavior normal.       UC Treatments / Results  Labs (all labs ordered are listed, but only abnormal results are displayed) Labs Reviewed - No data to display  EKG   Radiology No results found.  Procedures Procedures (including critical care time)  Medications Ordered in UC Medications - No data to display  Initial Impression / Assessment and Plan / UC Course  I have reviewed the triage vital signs and the nursing notes.  Pertinent labs & imaging results that were available during my care of the patient were reviewed by me and considered in my medical decision making (see chart for details).  Allergic contact dermatitis due to cosmetics  Reaction is most likely to Aquaphor as symptoms occur directly after applying, prednisone taper prescribed, may use topical calamine, Benadryl cream or oral antihistamines for management of pruritus, advised patient to monitor site for signs of infection due to recent tattoo placement and at any point if symptoms recur to return for reevaluation, advised against long exposure to heat to prevent further skin irritation and may follow-up with urgent care as needed Final Clinical Impressions(s) / UC Diagnoses   Final diagnoses:  Allergic contact dermatitis due to cosmetics     Discharge Instructions      Today you are being  treated for a rash that appears to be inflammatory in process, no signs of infection at this time, the small area of redness  that is located directly around the tattoo markings, monitor closely and at any point if you begin to see increased swelling, increased redness, increased pain or puslike drainage please return for reevaluation for infection  take prednisone every morning with food as directed, to help reduce the inflammatory process that occurs with this rash which will help minimize your itching as well as begin to clear  You may continue use of topical calamine or Benadryl cream to help manage itching, you may also continue oral Benadryl, Claritin or Zyrtec  Please avoid long exposures to heat such as a hot steamy shower or being outside as this may cause further irritation to your rash  You may follow-up with his urgent care as needed if symptoms persist or worsen    ED Prescriptions     Medication Sig Dispense Auth. Provider   predniSONE (STERAPRED UNI-PAK 21 TAB) 10 MG (21) TBPK tablet Take by mouth daily. Take 6 tabs by mouth daily  for 2 days, then 5 tabs for 2 days, then 4 tabs for 2 days, then 3 tabs for 2 days, 2 tabs for 2 days, then 1 tab by mouth daily for 2 days 42 tablet Lyndel Sarate, Elita Boone, NP      PDMP not reviewed this encounter.   Valinda Hoar, NP 04/17/22 1137

## 2022-04-17 NOTE — Discharge Instructions (Signed)
Today you are being treated for a rash that appears to be inflammatory in process, no signs of infection at this time, the small area of redness that is located directly around the tattoo markings, monitor closely and at any point if you begin to see increased swelling, increased redness, increased pain or puslike drainage please return for reevaluation for infection  take prednisone every morning with food as directed, to help reduce the inflammatory process that occurs with this rash which will help minimize your itching as well as begin to clear  You may continue use of topical calamine or Benadryl cream to help manage itching, you may also continue oral Benadryl, Claritin or Zyrtec  Please avoid long exposures to heat such as a hot steamy shower or being outside as this may cause further irritation to your rash  You may follow-up with his urgent care as needed if symptoms persist or worsen

## 2022-04-17 NOTE — ED Triage Notes (Signed)
Patient states that she has a itchy red rash after applying aquaphor on Tuesday.  Paitent states that she was using the ointment on her left arm because she had gotten a tattoo there.

## 2022-10-12 ENCOUNTER — Ambulatory Visit: Payer: Self-pay

## 2022-10-14 ENCOUNTER — Ambulatory Visit
Admission: RE | Admit: 2022-10-14 | Discharge: 2022-10-14 | Disposition: A | Discharge status: Home / Self Care | Payer: 59 | Source: Ambulatory Visit | Attending: Family Medicine | Admitting: Family Medicine

## 2022-10-14 ENCOUNTER — Ambulatory Visit (INDEPENDENT_AMBULATORY_CARE_PROVIDER_SITE_OTHER): Payer: 59

## 2022-10-14 VITALS — BP 121/80 | HR 71 | Temp 98.6°F | Resp 16

## 2022-10-14 DIAGNOSIS — R1030 Lower abdominal pain, unspecified: Secondary | ICD-10-CM | POA: Diagnosis not present

## 2022-10-14 LAB — COMPREHENSIVE METABOLIC PANEL
ALT: 23 U/L (ref 0–44)
AST: 22 U/L (ref 15–41)
Albumin: 4.3 g/dL (ref 3.5–5.0)
Alkaline Phosphatase: 91 U/L (ref 38–126)
Anion gap: 7 (ref 5–15)
BUN: 13 mg/dL (ref 6–20)
CO2: 25 mmol/L (ref 22–32)
Calcium: 8.8 mg/dL — ABNORMAL LOW (ref 8.9–10.3)
Chloride: 105 mmol/L (ref 98–111)
Creatinine, Ser: 0.76 mg/dL (ref 0.44–1.00)
GFR, Estimated: >60 mL/min (ref >60)
Glucose, Bld: 108 mg/dL — ABNORMAL HIGH (ref 70–99)
Potassium: 3.9 mmol/L (ref 3.5–5.1)
Sodium: 137 mmol/L (ref 135–145)
Total Bilirubin: 0.6 mg/dL (ref 0.3–1.2)
Total Protein: 7.8 g/dL (ref 6.5–8.1)

## 2022-10-14 LAB — CBC WITH DIFFERENTIAL/PLATELET
Abs Immature Granulocytes: 0.02 10*3/uL (ref 0.00–0.07)
Basophils Absolute: 0.1 10*3/uL (ref 0.0–0.1)
Basophils Relative: 1 %
Eosinophils Absolute: 0.2 10*3/uL (ref 0.0–0.5)
Eosinophils Relative: 4 %
HCT: 38.3 % (ref 36.0–46.0)
Hemoglobin: 12.9 g/dL (ref 12.0–15.0)
Immature Granulocytes: 0 %
Lymphocytes Relative: 41 %
Lymphs Abs: 2.1 10*3/uL (ref 0.7–4.0)
MCH: 28.7 pg (ref 26.0–34.0)
MCHC: 33.7 g/dL (ref 30.0–36.0)
MCV: 85.1 fL (ref 80.0–100.0)
Monocytes Absolute: 0.4 10*3/uL (ref 0.1–1.0)
Monocytes Relative: 8 %
Neutro Abs: 2.4 10*3/uL (ref 1.7–7.7)
Neutrophils Relative %: 46 %
Platelets: 313 10*3/uL (ref 150–400)
RBC: 4.5 MIL/uL (ref 3.87–5.11)
RDW: 13.1 % (ref 11.5–15.5)
WBC: 5.2 10*3/uL (ref 4.0–10.5)
nRBC: 0 % (ref 0.0–0.2)

## 2022-10-14 LAB — URINALYSIS, W/ REFLEX TO CULTURE (INFECTION SUSPECTED)
Bacteria, UA: NONE SEEN
Bilirubin Urine: NEGATIVE
Glucose, UA: NEGATIVE mg/dL
Ketones, ur: NEGATIVE mg/dL
Leukocytes,Ua: NEGATIVE
Nitrite: NEGATIVE
Protein, ur: NEGATIVE mg/dL
RBC / HPF: NONE SEEN RBC/hpf (ref 0–5)
Specific Gravity, Urine: 1.01 (ref 1.005–1.030)
Squamous Epithelial / HPF: NONE SEEN /HPF (ref 0–5)
WBC, UA: NONE SEEN WBC/hpf (ref 0–5)
pH: 6.5 (ref 5.0–8.0)

## 2022-10-14 MED ORDER — KETOROLAC TROMETHAMINE 30 MG/ML IJ SOLN
30.0000 mg | Freq: Once | INTRAMUSCULAR | Status: AC
  Administered 2022-10-14: 30 mg via INTRAMUSCULAR

## 2022-10-14 NOTE — ED Triage Notes (Signed)
Pt presents with lower back pain that radiates around her abdomen into her groin area for over 1 week. Pt reports urinary frequency as well.

## 2022-10-14 NOTE — ED Provider Notes (Signed)
[facility]    CSN: [id number] Arrival date & time: 10/14/22  [id number]      History   Chief Complaint Chief Complaint  Patient presents with   Abdominal Pain    Pain through pelvic area - Entered by patient   Back Pain   Groin Pain    HPI Joann Griffin is a 52 y.o. female.   HPI  Joann Griffin presents for lower abdominal pain described as pressure that radiates from the lower back down to her suprapubic area for the past 1-2 weeks. She took Tylenol and Acacia berry pills without relief.   Reports no simialar sx before. She thought it was a pinched nerve. She was told once befoe that she had a kidney stone but doesn't recall passing one. Last normal bowel movement was a few days ago but did have a small stool this morning. She does not have a bowel movement daily.    Symptoms Nausea/Vomiting: nausea  Diarrhea: no  Constipation: yes  Melena/BRBPR: no  Hematemesis: no  Anorexia: no  Fever/Chills: yes undocumented temperature, has chills  Dysuria: no  Rash: no  Wt loss: no  NSAIDs/ASA: no  LMP: perimenopausal, LMP >35month ago  Vaginal bleeding: no  STD risk/hx: non reported  Sleep disturbance: no due to the pain  Back Pain: yes  Headache: no   Past Medical History:  Diagnosis Date   Depression    Seasonal allergies     There are no problems to display for this patient.   Past Surgical History:  Procedure Laterality Date   CHOLECYSTECTOMY      OB History   No obstetric history on file.      Home Medications    Prior to Admission medications   Medication Sig Start Date End Date Taking? Authorizing Provider  acyclovir (ZOVIRAX) 200 MG capsule Take by mouth. 12/05/17  Yes [provider]  desvenlafaxine (PRISTIQ) 50 MG 24 hr tablet Take 50 mg by mouth daily.   Yes [provider]  cetirizine (ZYRTEC) 10 MG tablet Take 10 mg by mouth daily.    [provider]  cyclobenzaprine (FLEXERIL) 10 MG tablet Take 1 tablet (10 mg  total) by mouth 2 (two) times daily as needed for muscle spasms. Do not drive while taking as can cause drowsiness 12/30/18   MMarylene Land NP  ipratropium (ATROVENT) 0.06 % nasal spray Place 2 sprays into both nostrils 4 (four) times daily for 7 days. 05/27/20 06/03/20  ELaurene FootmanB, PA-C  ketorolac (TORADOL) 10 MG tablet Take 1 tablet (10 mg total) by mouth every 6 (six) hours as needed. 08/07/20   RMargarette Canada NP  montelukast (SINGULAIR) 10 MG tablet Take 10 mg by mouth daily. 10/16/20   [provider]  omeprazole (PRILOSEC) 10 MG capsule Take by mouth.    [provider]  predniSONE (STERAPRED UNI-PAK 21 TAB) 10 MG (21) TBPK tablet Take by mouth daily. Take 6 tabs by mouth daily  for 2 days, then 5 tabs for 2 days, then 4 tabs for 2 days, then 3 tabs for 2 days, 2 tabs for 2 days, then 1 tab by mouth daily for 2 days 04/17/22   WHans Eden NP  sulfamethoxazole-trimethoprim (BACTRIM DS) 800-160 MG tablet Take 1 tablet by mouth 2 (two) times daily. 06/23/20   Cuthriell, JCharline Bills PA-C  tamsulosin (FLOMAX) 0.4 MG CAPS capsule Take 1 capsule (0.4 mg total) by mouth daily. 08/07/20   RMargarette Canada NP  Family History Family History  Problem Relation Age of Onset   Heart disease Mother    Hypertension Mother    Diabetes Father    Breast cancer Neg Hx     Social History Social History   Tobacco Use   Smoking status: Never   Smokeless tobacco: Never  Vaping Use   Vaping Use: Every day   Substances: Nicotine, Flavoring  Substance Use Topics   Alcohol use: Yes    Comment: 10-15 beers per week   Drug use: Never     Allergies   Patient has no known allergies.   Review of Systems Review of Systems :negative unless otherwise stated in HPI.      Physical Exam Triage Vital Signs ED Triage Vitals  Enc Vitals Group     BP 10/14/22 0952 121/80     Pulse Rate 10/14/22 0952 71     Resp 10/14/22 0952 16     Temp 10/14/22 0952 98.6 F (37 C)     Temp  Source 10/14/22 0952 Oral     SpO2 10/14/22 0952 99 %     Weight --      Height --      Head Circumference --      Peak Flow --      Pain Score 10/14/22 0950 10     Pain Loc --      Pain Edu? --      Excl. in [place]? --    No data found.  Updated Vital Signs BP 121/80 (BP Location: Left Arm)   Pulse 71   Temp 98.6 F (37 C) (Oral)   Resp 16   LMP 10/04/2020 (Exact Date)   SpO2 99%   Visual Acuity Right Eye Distance:   Left Eye Distance:   Bilateral Distance:    Right Eye Near:   Left Eye Near:    Bilateral Near:     Physical Exam  GEN: well appearing female, in no acute distress   CV: regular rate and rhythm RESP: no increased work of breathing, clear to ascultation bilaterally ABD: Bowel sounds present. Soft, RLQ and LLQ abdominal tenderness, non-distended.  No guarding, no rebound, no appreciable hepatosplenomegaly, no CVA tenderness, equivocal McBurney's,negative Murphy MSK: no extremity edema SKIN: warm, dry, no rash on visible skin NEURO: alert, moves all extremities appropriately PSYCH: Normal affect, appropriate speech and behavior   UC Treatments / Results  Labs (all labs ordered are listed, but only abnormal results are displayed) Labs Reviewed  URINALYSIS, W/ REFLEX TO CULTURE (INFECTION SUSPECTED) - Abnormal; Notable for the following components:      Result Value   Hgb urine dipstick TRACE (*)    All other components within normal limits  COMPREHENSIVE METABOLIC PANEL - Abnormal; Notable for the following components:   Glucose, Bld 108 (*)    Calcium 8.8 (*)    All other components within normal limits  CBC WITH DIFFERENTIAL/PLATELET    EKG   Radiology DG Abd 2 Views  Result Date: 10/14/2022 CLINICAL DATA:  Abdominal pain. Lower back pain that radiates bilaterally around her abdomen into her groin area for 1 week. EXAM: ABDOMEN - 2 VIEW COMPARISON:  None Available. FINDINGS: The bowel gas pattern is normal. There is no evidence of free air. No  radio-opaque calculi. Cholecystectomy clips project over the right upper quadrant. Mild degenerative changes of the lumbar spine. IMPRESSION: Normal bowel-gas pattern. Mild degenerative changes of the lumbar spine. Electronically Signed   By: Lyndal Rainbow.D.  On: 10/14/2022 11:13    Procedures Procedures (including critical care time)  Medications Ordered in UC Medications  ketorolac (TORADOL) 30 MG/ML injection 30 mg (30 mg Intramuscular Given 10/14/22 1050)    Initial Impression / Assessment and Plan / UC Course  I have reviewed the triage vital signs and the nursing notes.  Pertinent labs & imaging results that were available during my care of the patient were reviewed by me and considered in my medical decision making (see chart for details).       Patient is a  52 y.o. female with history kidney stone who presents after having insidious moderately severe abdominal pain about for past 1-2 weeks.  Overall, patient is well-appearing, well-hydrated, and in no acute distress.  Vital signs stable.  Lorilei is afebrile.  Exam is not concerning for an acute abdomen though she has RLQ and LLQ abdominal tenderness.  Obtained UA, CBC, CMP, and KUB.  Has personal history of kidney stones but doesn't recall every passing one.   Urinalysis not concerning for acute cystitis.  She did have some hematuria but this was not supported on microscopy.  I doubt that she has an acute kidney stone.  She is not concerned for an STI.  She is likely postmenopausal as her period has been absent for about 12 months.  Not likely to be pregnant.  Though she has some right lower quadrant abdominal tenderness she is not febrile and no leukocytosis on CBC.  There is no significant electrolyte abnormalities, liver function or kidney function abnormalities.  KUB showing stool burden in the right side.  She may have constipation causing her symptoms.  Offered a pelvic ultrasound to evaluate for possible ovarian cyst or  fibroids but she declined.  Patient prefers to try constipation cleanout and if symptoms or not resolving to go to the emergency department for a CT scan. Provided and reviewed constipation clean out and maintenance plan with patient. Discussed eating small meals frequently and increased fluid intake, as well as gradual increase in fiber intake with dried fruits, vegetables with skins, beans, whole grains and cereal. Goal 25-35g of fiber per day. Encouraged increased physical activity.    Follow-up, return and ED precautions given.  Discussed MDM, treatment plan and plan for follow-up with patient who agrees with plan.    Final Clinical Impressions(s) / UC Diagnoses   Final diagnoses:  Lower abdominal pain     Discharge Instructions      Your blood and urine sample did not show cause of your abdominal pain. Your xray did not show a kidney stone but did indicate stool in the same area of your pain.   You are constipated and need help to clean out the large amount of stool (poop) in the intestine. This guide tells you what medicine to use.  What do I need to know before starting the clean out?  It will take about 4 to 6 hours to take the medicine.  After taking the medicine, you should have a large stool within 24 hours.  Plan to stay close to a bathroom until the stool has passed. After the intestine is cleaned out, you will need to take a daily medicine.   Remember:  Constipation can last a long time. It may take 6 to 12 months for you to get back to regular bowel movements (BMs). Be patient. Things will get better slowly over time.    When should you start the clean out?  Start the home clean  out on a Friday afternoon or some other time when you will be home (and not at school).  Start between 2:00 and 4:00 in the afternoon.  You should have almost clear liquid stools by the end of the next day. If the medicine does not work or you don't know if it worked, Pharmacist, hospital or  nurse.  What medicine do I need to take?  You need to take Miralax, a powder that you mix in a clear liquid.  Follow these steps: ?    Stir the Miralax powder into water, juice, or Gatorade. Your Miralax dose is: 8 capfuls of Miralax powder in 32  ounces of liquid. Take 2 tablets of senna-colace twice daily.  ?    Drink 4 to 8 ounces every 30 minutes. It will take 4 to 6 hours to finish the medicine. ?    After the medicine is gone, drink more water or juice. This will help with the cleanout.   -     If the medicine gives you an upset stomach, slow down or stop.   Do I need to keep taking medicine?                                                                                                      After the clean out, you will take a daily (maintenance) medicine for at least 3 months. Your Miralax dose is:    1  capful of powder in 8 ounces of liquid every day   What if I get constipated again?  Some people need to have the clean out more than one time for the problem to go away. Contact your doctor to ask if you should repeat the clean out. It is OK to do it again, but you should wait at least a week before repeating the clean out.    Will I have any problems with the medicine?   You may have stomach pain or cramping during the clean out. This might mean you have to go to the bathroom.   Take some time to sit on the toilet. The pain will go away when the stool is gone. You may want to read while you wait. A warm bath may also help.   What should I eat and drink?  Drink lots of water and juice. Fruits and vegetables are good foods to eat. Try to avoid greasy and fatty foods. Goal is to get 25-35 grams of fiber daily.        ED Prescriptions   None    PDMP not reviewed this encounter.   Lyndee Hensen, DO 10/14/22 1226

## 2022-10-14 NOTE — Discharge Instructions (Signed)
Your blood and urine sample did not show cause of your abdominal pain. Your xray did not show a kidney stone but did indicate stool in the same area of your pain.   You are constipated and need help to clean out the large amount of stool (poop) in the intestine. This guide tells you what medicine to use.  What do I need to know before starting the clean out?  It will take about 4 to 6 hours to take the medicine.  After taking the medicine, you should have a large stool within 24 hours.  Plan to stay close to a bathroom until the stool has passed. After the intestine is cleaned out, you will need to take a daily medicine.   Remember:  Constipation can last a long time. It may take 6 to 12 months for you to get back to regular bowel movements (BMs). Be patient. Things will get better slowly over time.    When should you start the clean out?  Start the home clean out on a Friday afternoon or some other time when you will be home (and not at school).  Start between 2:00 and 4:00 in the afternoon.  You should have almost clear liquid stools by the end of the next day. If the medicine does not work or you don't know if it worked, Pharmacist, hospital or nurse.  What medicine do I need to take?  You need to take Miralax, a powder that you mix in a clear liquid.  Follow these steps: ?    Stir the Miralax powder into water, juice, or Gatorade. Your Miralax dose is: 8 capfuls of Miralax powder in 32  ounces of liquid. Take 2 tablets of senna-colace twice daily.  ?    Drink 4 to 8 ounces every 30 minutes. It will take 4 to 6 hours to finish the medicine. ?    After the medicine is gone, drink more water or juice. This will help with the cleanout.   -     If the medicine gives you an upset stomach, slow down or stop.   Do I need to keep taking medicine?                                                                                                      After the clean out, you will take a daily  (maintenance) medicine for at least 3 months. Your Miralax dose is:    1  capful of powder in 8 ounces of liquid every day   What if I get constipated again?  Some people need to have the clean out more than one time for the problem to go away. Contact your doctor to ask if you should repeat the clean out. It is OK to do it again, but you should wait at least a week before repeating the clean out.    Will I have any problems with the medicine?   You may have stomach pain or cramping during the clean out. This might mean you have to go to the  bathroom.   Take some time to sit on the toilet. The pain will go away when the stool is gone. You may want to read while you wait. A warm bath may also help.   What should I eat and drink?  Drink lots of water and juice. Fruits and vegetables are good foods to eat. Try to avoid greasy and fatty foods. Goal is to get 25-35 grams of fiber daily.

## 2023-05-30 ENCOUNTER — Other Ambulatory Visit: Payer: Self-pay | Admitting: Physician Assistant

## 2023-05-30 DIAGNOSIS — Z1231 Encounter for screening mammogram for malignant neoplasm of breast: Secondary | ICD-10-CM

## 2023-06-01 ENCOUNTER — Ambulatory Visit
Admission: RE | Admit: 2023-06-01 | Discharge: 2023-06-01 | Disposition: A | Discharge status: Home / Self Care | Payer: 59 | Source: Ambulatory Visit | Attending: Physician Assistant | Admitting: Physician Assistant

## 2023-06-01 DIAGNOSIS — Z1231 Encounter for screening mammogram for malignant neoplasm of breast: Secondary | ICD-10-CM | POA: Diagnosis present

## 2024-07-19 ENCOUNTER — Other Ambulatory Visit: Payer: Self-pay | Admitting: Physician Assistant

## 2024-07-19 DIAGNOSIS — Z1231 Encounter for screening mammogram for malignant neoplasm of breast: Secondary | ICD-10-CM

## 2024-09-03 ENCOUNTER — Ambulatory Visit
Admission: RE | Admit: 2024-09-03 | Discharge: 2024-09-03 | Disposition: A | Discharge status: Home / Self Care | Source: Ambulatory Visit | Attending: Physician Assistant | Admitting: Physician Assistant

## 2024-09-03 DIAGNOSIS — Z1231 Encounter for screening mammogram for malignant neoplasm of breast: Secondary | ICD-10-CM | POA: Insufficient documentation
